# Patient Record
Sex: Male | Born: 1955 | Race: White | Hispanic: No | Marital: Single | Smoking: Former smoker
Health system: Southern US, Community
[De-identification: ages and names within clinical notes are randomized; demographics above are authoritative.]

## PROBLEM LIST (undated history)

## (undated) DIAGNOSIS — E119 Type 2 diabetes mellitus without complications: Secondary | ICD-10-CM

## (undated) DIAGNOSIS — I10 Essential (primary) hypertension: Secondary | ICD-10-CM

## (undated) DIAGNOSIS — E079 Disorder of thyroid, unspecified: Secondary | ICD-10-CM

## (undated) DIAGNOSIS — I6529 Occlusion and stenosis of unspecified carotid artery: Secondary | ICD-10-CM

## (undated) HISTORY — PX: EYE SURGERY: SHX253

## (undated) HISTORY — DX: Essential (primary) hypertension: I10

## (undated) HISTORY — DX: Type 2 diabetes mellitus without complications: E11.9

## (undated) HISTORY — PX: CATARACT EXTRACTION: SUR2

## (undated) HISTORY — DX: Occlusion and stenosis of unspecified carotid artery: I65.29

## (undated) HISTORY — DX: Disorder of thyroid, unspecified: E07.9

## (undated) HISTORY — PX: CHOLECYSTECTOMY: SHX55

## (undated) HISTORY — PX: THYROID SURGERY: SHX805

## (undated) HISTORY — PX: CAROTID ENDARTERECTOMY: SUR193

---

## 2004-01-29 ENCOUNTER — Ambulatory Visit: Payer: Self-pay | Admitting: Urgent Care

## 2004-02-07 ENCOUNTER — Ambulatory Visit (HOSPITAL_COMMUNITY): Admission: RE | Admit: 2004-02-07 | Discharge: 2004-02-07 | Payer: Self-pay | Admitting: Internal Medicine

## 2004-02-07 ENCOUNTER — Ambulatory Visit: Payer: Self-pay | Admitting: Internal Medicine

## 2004-02-26 ENCOUNTER — Ambulatory Visit: Payer: Self-pay | Admitting: Cardiology

## 2004-03-04 ENCOUNTER — Ambulatory Visit (HOSPITAL_COMMUNITY): Admission: RE | Admit: 2004-03-04 | Discharge: 2004-03-04 | Payer: Self-pay | Admitting: Cardiology

## 2004-03-04 ENCOUNTER — Ambulatory Visit: Payer: Self-pay | Admitting: Cardiology

## 2004-03-13 ENCOUNTER — Ambulatory Visit: Payer: Self-pay | Admitting: Cardiology

## 2005-02-04 ENCOUNTER — Ambulatory Visit: Payer: Self-pay | Admitting: Internal Medicine

## 2005-02-10 ENCOUNTER — Encounter (HOSPITAL_COMMUNITY): Admission: RE | Admit: 2005-02-10 | Discharge: 2005-02-10 | Payer: Self-pay | Admitting: Internal Medicine

## 2005-04-03 ENCOUNTER — Ambulatory Visit: Payer: Self-pay | Admitting: Internal Medicine

## 2005-04-07 ENCOUNTER — Ambulatory Visit (HOSPITAL_COMMUNITY): Admission: RE | Admit: 2005-04-07 | Discharge: 2005-04-07 | Payer: Self-pay | Admitting: Internal Medicine

## 2005-05-04 ENCOUNTER — Ambulatory Visit: Payer: Self-pay | Admitting: Internal Medicine

## 2005-06-01 ENCOUNTER — Ambulatory Visit: Payer: Self-pay | Admitting: Internal Medicine

## 2005-06-05 ENCOUNTER — Ambulatory Visit: Payer: Self-pay | Admitting: Cardiology

## 2005-06-22 ENCOUNTER — Ambulatory Visit: Payer: Self-pay | Admitting: Cardiology

## 2005-07-13 ENCOUNTER — Ambulatory Visit: Payer: Self-pay | Admitting: Cardiology

## 2005-07-14 ENCOUNTER — Ambulatory Visit: Payer: Self-pay | Admitting: Cardiology

## 2005-07-16 ENCOUNTER — Inpatient Hospital Stay (HOSPITAL_COMMUNITY): Admission: AD | Admit: 2005-07-16 | Discharge: 2005-07-24 | Payer: Self-pay | Admitting: *Deleted

## 2005-07-20 ENCOUNTER — Encounter (INDEPENDENT_AMBULATORY_CARE_PROVIDER_SITE_OTHER): Payer: Self-pay | Admitting: Specialist

## 2005-09-07 ENCOUNTER — Ambulatory Visit: Payer: Self-pay | Admitting: Cardiology

## 2008-07-19 ENCOUNTER — Inpatient Hospital Stay: Admission: RE | Admit: 2008-07-19 | Discharge: 2008-10-01 | Payer: Self-pay | Admitting: Internal Medicine

## 2008-07-31 ENCOUNTER — Ambulatory Visit (HOSPITAL_COMMUNITY): Admission: RE | Admit: 2008-07-31 | Discharge: 2008-07-31 | Payer: Self-pay | Admitting: Internal Medicine

## 2008-07-31 ENCOUNTER — Emergency Department (HOSPITAL_COMMUNITY): Admission: EM | Admit: 2008-07-31 | Discharge: 2008-08-01 | Payer: Self-pay | Admitting: Emergency Medicine

## 2010-03-31 ENCOUNTER — Encounter (HOSPITAL_BASED_OUTPATIENT_CLINIC_OR_DEPARTMENT_OTHER): Payer: Self-pay | Admitting: Internal Medicine

## 2010-06-15 LAB — GLUCOSE, CAPILLARY
Glucose-Capillary: 110 mg/dL — ABNORMAL HIGH (ref 70–99)
Glucose-Capillary: 126 mg/dL — ABNORMAL HIGH (ref 70–99)
Glucose-Capillary: 130 mg/dL — ABNORMAL HIGH (ref 70–99)
Glucose-Capillary: 144 mg/dL — ABNORMAL HIGH (ref 70–99)
Glucose-Capillary: 146 mg/dL — ABNORMAL HIGH (ref 70–99)
Glucose-Capillary: 149 mg/dL — ABNORMAL HIGH (ref 70–99)
Glucose-Capillary: 152 mg/dL — ABNORMAL HIGH (ref 70–99)
Glucose-Capillary: 152 mg/dL — ABNORMAL HIGH (ref 70–99)
Glucose-Capillary: 155 mg/dL — ABNORMAL HIGH (ref 70–99)
Glucose-Capillary: 156 mg/dL — ABNORMAL HIGH (ref 70–99)
Glucose-Capillary: 160 mg/dL — ABNORMAL HIGH (ref 70–99)
Glucose-Capillary: 184 mg/dL — ABNORMAL HIGH (ref 70–99)
Glucose-Capillary: 186 mg/dL — ABNORMAL HIGH (ref 70–99)
Glucose-Capillary: 193 mg/dL — ABNORMAL HIGH (ref 70–99)
Glucose-Capillary: 195 mg/dL — ABNORMAL HIGH (ref 70–99)
Glucose-Capillary: 195 mg/dL — ABNORMAL HIGH (ref 70–99)
Glucose-Capillary: 197 mg/dL — ABNORMAL HIGH (ref 70–99)
Glucose-Capillary: 227 mg/dL — ABNORMAL HIGH (ref 70–99)
Glucose-Capillary: 234 mg/dL — ABNORMAL HIGH (ref 70–99)
Glucose-Capillary: 262 mg/dL — ABNORMAL HIGH (ref 70–99)
Glucose-Capillary: 267 mg/dL — ABNORMAL HIGH (ref 70–99)
Glucose-Capillary: 282 mg/dL — ABNORMAL HIGH (ref 70–99)
Glucose-Capillary: 297 mg/dL — ABNORMAL HIGH (ref 70–99)
Glucose-Capillary: 309 mg/dL — ABNORMAL HIGH (ref 70–99)
Glucose-Capillary: 437 mg/dL — ABNORMAL HIGH (ref 70–99)
Glucose-Capillary: 451 mg/dL — ABNORMAL HIGH (ref 70–99)
Glucose-Capillary: 53 mg/dL — ABNORMAL LOW (ref 70–99)
Glucose-Capillary: 74 mg/dL (ref 70–99)
Glucose-Capillary: 96 mg/dL (ref 70–99)
Glucose-Capillary: 102 mg/dL — ABNORMAL HIGH (ref 70–99)
Glucose-Capillary: 131 mg/dL — ABNORMAL HIGH (ref 70–99)
Glucose-Capillary: 134 mg/dL — ABNORMAL HIGH (ref 70–99)
Glucose-Capillary: 143 mg/dL — ABNORMAL HIGH (ref 70–99)
Glucose-Capillary: 161 mg/dL — ABNORMAL HIGH (ref 70–99)
Glucose-Capillary: 165 mg/dL — ABNORMAL HIGH (ref 70–99)
Glucose-Capillary: 171 mg/dL — ABNORMAL HIGH (ref 70–99)
Glucose-Capillary: 176 mg/dL — ABNORMAL HIGH (ref 70–99)
Glucose-Capillary: 178 mg/dL — ABNORMAL HIGH (ref 70–99)
Glucose-Capillary: 178 mg/dL — ABNORMAL HIGH (ref 70–99)
Glucose-Capillary: 185 mg/dL — ABNORMAL HIGH (ref 70–99)
Glucose-Capillary: 189 mg/dL — ABNORMAL HIGH (ref 70–99)
Glucose-Capillary: 207 mg/dL — ABNORMAL HIGH (ref 70–99)
Glucose-Capillary: 212 mg/dL — ABNORMAL HIGH (ref 70–99)
Glucose-Capillary: 217 mg/dL — ABNORMAL HIGH (ref 70–99)
Glucose-Capillary: 222 mg/dL — ABNORMAL HIGH (ref 70–99)
Glucose-Capillary: 227 mg/dL — ABNORMAL HIGH (ref 70–99)
Glucose-Capillary: 238 mg/dL — ABNORMAL HIGH (ref 70–99)
Glucose-Capillary: 243 mg/dL — ABNORMAL HIGH (ref 70–99)
Glucose-Capillary: 257 mg/dL — ABNORMAL HIGH (ref 70–99)
Glucose-Capillary: 267 mg/dL — ABNORMAL HIGH (ref 70–99)
Glucose-Capillary: 297 mg/dL — ABNORMAL HIGH (ref 70–99)
Glucose-Capillary: 304 mg/dL — ABNORMAL HIGH (ref 70–99)
Glucose-Capillary: 319 mg/dL — ABNORMAL HIGH (ref 70–99)
Glucose-Capillary: 82 mg/dL (ref 70–99)
Glucose-Capillary: 89 mg/dL (ref 70–99)
Glucose-Capillary: 97 mg/dL (ref 70–99)

## 2010-06-16 LAB — GLUCOSE, CAPILLARY
Glucose-Capillary: 105 mg/dL — ABNORMAL HIGH (ref 70–99)
Glucose-Capillary: 105 mg/dL — ABNORMAL HIGH (ref 70–99)
Glucose-Capillary: 108 mg/dL — ABNORMAL HIGH (ref 70–99)
Glucose-Capillary: 111 mg/dL — ABNORMAL HIGH (ref 70–99)
Glucose-Capillary: 114 mg/dL — ABNORMAL HIGH (ref 70–99)
Glucose-Capillary: 117 mg/dL — ABNORMAL HIGH (ref 70–99)
Glucose-Capillary: 117 mg/dL — ABNORMAL HIGH (ref 70–99)
Glucose-Capillary: 122 mg/dL — ABNORMAL HIGH (ref 70–99)
Glucose-Capillary: 140 mg/dL — ABNORMAL HIGH (ref 70–99)
Glucose-Capillary: 143 mg/dL — ABNORMAL HIGH (ref 70–99)
Glucose-Capillary: 145 mg/dL — ABNORMAL HIGH (ref 70–99)
Glucose-Capillary: 156 mg/dL — ABNORMAL HIGH (ref 70–99)
Glucose-Capillary: 156 mg/dL — ABNORMAL HIGH (ref 70–99)
Glucose-Capillary: 166 mg/dL — ABNORMAL HIGH (ref 70–99)
Glucose-Capillary: 167 mg/dL — ABNORMAL HIGH (ref 70–99)
Glucose-Capillary: 168 mg/dL — ABNORMAL HIGH (ref 70–99)
Glucose-Capillary: 169 mg/dL — ABNORMAL HIGH (ref 70–99)
Glucose-Capillary: 172 mg/dL — ABNORMAL HIGH (ref 70–99)
Glucose-Capillary: 182 mg/dL — ABNORMAL HIGH (ref 70–99)
Glucose-Capillary: 194 mg/dL — ABNORMAL HIGH (ref 70–99)
Glucose-Capillary: 199 mg/dL — ABNORMAL HIGH (ref 70–99)
Glucose-Capillary: 206 mg/dL — ABNORMAL HIGH (ref 70–99)
Glucose-Capillary: 211 mg/dL — ABNORMAL HIGH (ref 70–99)
Glucose-Capillary: 212 mg/dL — ABNORMAL HIGH (ref 70–99)
Glucose-Capillary: 235 mg/dL — ABNORMAL HIGH (ref 70–99)
Glucose-Capillary: 242 mg/dL — ABNORMAL HIGH (ref 70–99)
Glucose-Capillary: 245 mg/dL — ABNORMAL HIGH (ref 70–99)
Glucose-Capillary: 255 mg/dL — ABNORMAL HIGH (ref 70–99)
Glucose-Capillary: 255 mg/dL — ABNORMAL HIGH (ref 70–99)
Glucose-Capillary: 255 mg/dL — ABNORMAL HIGH (ref 70–99)
Glucose-Capillary: 256 mg/dL — ABNORMAL HIGH (ref 70–99)
Glucose-Capillary: 257 mg/dL — ABNORMAL HIGH (ref 70–99)
Glucose-Capillary: 279 mg/dL — ABNORMAL HIGH (ref 70–99)
Glucose-Capillary: 285 mg/dL — ABNORMAL HIGH (ref 70–99)
Glucose-Capillary: 339 mg/dL — ABNORMAL HIGH (ref 70–99)
Glucose-Capillary: 356 mg/dL — ABNORMAL HIGH (ref 70–99)
Glucose-Capillary: 414 mg/dL — ABNORMAL HIGH (ref 70–99)
Glucose-Capillary: 60 mg/dL — ABNORMAL LOW (ref 70–99)
Glucose-Capillary: 62 mg/dL — ABNORMAL LOW (ref 70–99)
Glucose-Capillary: 87 mg/dL (ref 70–99)
Glucose-Capillary: 93 mg/dL (ref 70–99)
Glucose-Capillary: 97 mg/dL (ref 70–99)
Glucose-Capillary: 100 mg/dL — ABNORMAL HIGH (ref 70–99)
Glucose-Capillary: 128 mg/dL — ABNORMAL HIGH (ref 70–99)
Glucose-Capillary: 134 mg/dL — ABNORMAL HIGH (ref 70–99)
Glucose-Capillary: 139 mg/dL — ABNORMAL HIGH (ref 70–99)
Glucose-Capillary: 140 mg/dL — ABNORMAL HIGH (ref 70–99)
Glucose-Capillary: 159 mg/dL — ABNORMAL HIGH (ref 70–99)
Glucose-Capillary: 183 mg/dL — ABNORMAL HIGH (ref 70–99)
Glucose-Capillary: 195 mg/dL — ABNORMAL HIGH (ref 70–99)
Glucose-Capillary: 199 mg/dL — ABNORMAL HIGH (ref 70–99)
Glucose-Capillary: 210 mg/dL — ABNORMAL HIGH (ref 70–99)
Glucose-Capillary: 213 mg/dL — ABNORMAL HIGH (ref 70–99)
Glucose-Capillary: 215 mg/dL — ABNORMAL HIGH (ref 70–99)
Glucose-Capillary: 215 mg/dL — ABNORMAL HIGH (ref 70–99)
Glucose-Capillary: 215 mg/dL — ABNORMAL HIGH (ref 70–99)
Glucose-Capillary: 250 mg/dL — ABNORMAL HIGH (ref 70–99)
Glucose-Capillary: 271 mg/dL — ABNORMAL HIGH (ref 70–99)
Glucose-Capillary: 274 mg/dL — ABNORMAL HIGH (ref 70–99)
Glucose-Capillary: 277 mg/dL — ABNORMAL HIGH (ref 70–99)
Glucose-Capillary: 299 mg/dL — ABNORMAL HIGH (ref 70–99)
Glucose-Capillary: 303 mg/dL — ABNORMAL HIGH (ref 70–99)
Glucose-Capillary: 308 mg/dL — ABNORMAL HIGH (ref 70–99)
Glucose-Capillary: 318 mg/dL — ABNORMAL HIGH (ref 70–99)
Glucose-Capillary: 318 mg/dL — ABNORMAL HIGH (ref 70–99)
Glucose-Capillary: 320 mg/dL — ABNORMAL HIGH (ref 70–99)
Glucose-Capillary: 348 mg/dL — ABNORMAL HIGH (ref 70–99)
Glucose-Capillary: 372 mg/dL — ABNORMAL HIGH (ref 70–99)
Glucose-Capillary: 67 mg/dL — ABNORMAL LOW (ref 70–99)
Glucose-Capillary: 75 mg/dL (ref 70–99)
Glucose-Capillary: 97 mg/dL (ref 70–99)

## 2010-06-17 LAB — GLUCOSE, CAPILLARY
Glucose-Capillary: 101 mg/dL — ABNORMAL HIGH (ref 70–99)
Glucose-Capillary: 152 mg/dL — ABNORMAL HIGH (ref 70–99)
Glucose-Capillary: 169 mg/dL — ABNORMAL HIGH (ref 70–99)
Glucose-Capillary: 176 mg/dL — ABNORMAL HIGH (ref 70–99)
Glucose-Capillary: 214 mg/dL — ABNORMAL HIGH (ref 70–99)
Glucose-Capillary: 226 mg/dL — ABNORMAL HIGH (ref 70–99)
Glucose-Capillary: 228 mg/dL — ABNORMAL HIGH (ref 70–99)
Glucose-Capillary: 231 mg/dL — ABNORMAL HIGH (ref 70–99)
Glucose-Capillary: 246 mg/dL — ABNORMAL HIGH (ref 70–99)
Glucose-Capillary: 262 mg/dL — ABNORMAL HIGH (ref 70–99)
Glucose-Capillary: 262 mg/dL — ABNORMAL HIGH (ref 70–99)
Glucose-Capillary: 276 mg/dL — ABNORMAL HIGH (ref 70–99)
Glucose-Capillary: 292 mg/dL — ABNORMAL HIGH (ref 70–99)
Glucose-Capillary: 310 mg/dL — ABNORMAL HIGH (ref 70–99)
Glucose-Capillary: 310 mg/dL — ABNORMAL HIGH (ref 70–99)
Glucose-Capillary: 319 mg/dL — ABNORMAL HIGH (ref 70–99)
Glucose-Capillary: 329 mg/dL — ABNORMAL HIGH (ref 70–99)
Glucose-Capillary: 332 mg/dL — ABNORMAL HIGH (ref 70–99)
Glucose-Capillary: 355 mg/dL — ABNORMAL HIGH (ref 70–99)
Glucose-Capillary: 370 mg/dL — ABNORMAL HIGH (ref 70–99)
Glucose-Capillary: 374 mg/dL — ABNORMAL HIGH (ref 70–99)
Glucose-Capillary: 386 mg/dL — ABNORMAL HIGH (ref 70–99)
Glucose-Capillary: 393 mg/dL — ABNORMAL HIGH (ref 70–99)
Glucose-Capillary: 401 mg/dL — ABNORMAL HIGH (ref 70–99)
Glucose-Capillary: 410 mg/dL — ABNORMAL HIGH (ref 70–99)
Glucose-Capillary: 415 mg/dL — ABNORMAL HIGH (ref 70–99)
Glucose-Capillary: 444 mg/dL — ABNORMAL HIGH (ref 70–99)
Glucose-Capillary: 454 mg/dL — ABNORMAL HIGH (ref 70–99)
Glucose-Capillary: 277 mg/dL — ABNORMAL HIGH (ref 70–99)
Glucose-Capillary: 460 mg/dL — ABNORMAL HIGH (ref 70–99)

## 2010-06-17 LAB — BASIC METABOLIC PANEL
CO2: 29 mEq/L (ref 19–32)
Chloride: 96 mEq/L (ref 96–112)
GFR calc non Af Amer: >60 mL/min (ref >60)
Glucose, Bld: 271 mg/dL — ABNORMAL HIGH (ref 70–99)
Potassium: 4.6 mEq/L (ref 3.5–5.1)
Sodium: 132 mEq/L — ABNORMAL LOW (ref 135–145)

## 2010-06-17 LAB — CBC
HCT: 27.8 % — ABNORMAL LOW (ref 39.0–52.0)
Hemoglobin: 9.7 g/dL — ABNORMAL LOW (ref 13.0–17.0)
RDW: 13.4 % (ref 11.5–15.5)

## 2010-06-17 LAB — CULTURE, BLOOD (ROUTINE X 2)
Culture: NO GROWTH
Report Status: 5312010
Report Status: 5312010

## 2010-06-17 LAB — DIFFERENTIAL
Basophils Absolute: 0 10*3/uL (ref 0.0–0.1)
Eosinophils Relative: 1 % (ref 0–5)
Lymphocytes Relative: 6 % — ABNORMAL LOW (ref 12–46)
Monocytes Absolute: 0.9 10*3/uL (ref 0.1–1.0)

## 2010-07-25 NOTE — Op Note (Signed)
NAMETERREN, JANDREAU                 ACCOUNT NO.:  [id number]   MEDICAL RECORD NO.:  [id number]          PATIENT TYPE:  INP   LOCATION:  3316                         FACILITY:  [facility]   PHYSICIAN:  Balinda Quails, M.D.    DATE OF BIRTH:  May 25, 1955   DATE OF PROCEDURE:  07/20/2005  DATE OF DISCHARGE:                                 OPERATIVE REPORT   SURGEON:  Balinda Quails, MD   ASSISTANT:  Pecola Leisure, PA.   ANESTHETIC:  General endotracheal.   ANESTHESIOLOGIST:  Dr. Krista Blue.   PREOPERATIVE DIAGNOSIS:  Severe right internal carotid artery stenosis.   POSTOP:  Severe right internal carotid artery stenosis.   PROCEDURE:  Right carotid endarterectomy Dacron patch angioplasty.   CLINICAL NOTE:  Charles Castaneda is a 55 year old type 1 diabetic who was  recently seen in the office with a severe right internal carotid artery  stenosis.  At that time some subtle mental status changes had been  identified and he was admitted to hospital.  Workup for this was essentially  unremarkable.  He is brought to the operating room at this time for right  carotid endarterectomy for reduction of stroke risk.   OPERATIVE PROCEDURE:  The patient left the operating room stable condition.  Placed in supine position.  General endotracheal anesthesia induced.  Foley  catheter and arterial line in place.  Right neck prepped and draped in  sterile fashion.   Curvilinear skin incision made on the anterior border of the right  sternomastoid muscle.  Dissection carried through subcutaneous tissue with  electrocautery.  Deep dissection carried down through the platysma.  The  dissection carried along the upper border sternomastoid to expose the  carotid bifurcation.  Facial vein ligated with 2-0 silk and divided.  Carotid bifurcation exposed.  Common carotid artery mobilized down the  omohyoid muscle encircled with vessel loop.  The vagus nerve reflected  posteriorly and preserved.  The origin of superior  thyroid external carotid  were freed and encircled vessels.  The internal carotid artery followed  distally up to the posterior belly of the digastric muscle and encircled  with vessel loop.  The hypoglossal nerve was identified superiorly and  preserved.   Carotid vessels were noted to be small in caliber with plaque extending from  the bulb up into the internal carotid artery origin.  The patient  administered 7000 units heparin intravenously.  Adequate circulation time  permitted.  The carotid vessels controlled with clamps.  Longitudinal  arteriotomy made in the distal common carotid artery.  The arteriotomy  extended across carotid bulb up into the internal carotid artery.  There was  a high-grade stenosis in the right internal carotid artery present.  Shunt  was inserted.   Plaque removed with an endarterectomy elevator.  The plaque raised up into  the internal carotid artery where it feathered out distally.  The external  carotid was endarterectomized using an eversion technique.  Plaque raised  down into the common carotid artery where it was divided transversely with  Potts scissors.  Fragments of plaque were removed  with fine forceps.  This  area with heparin saline solution and dextran solution.  A patch angioplasty  of the endarterectomy site carried out with a running 6-0 Prolene suture  using a Finesse Dacron patch.  The completion of the patch angioplasty,  shunt was removed.  All vessels well flushed.  Clamps removed directing  initial antegrade flow up the external carotid artery.  Following this, the  internal carotid was released.   There is an excellent pulse and Doppler signal in the distal internal  carotid artery.   Adequate hemostasis obtained.  Sponge and instrument counts correct.  This  patient administered 50 mg protamine intravenously.   Sternomastoid fascia closed running 2-0 Vicryl suture.  Platysma closed with  running 3-0 Vicryl suture.  Skin closed  with 4-0 Monocryl.  Steri-Strips  applied.   The patient tolerated procedure well.  Transferred to recovery in stable  condition.      Balinda Quails, M.D.  Electronically Signed     PGH/MEDQ  D:  07/20/2005  T:  07/21/2005  Job:  [id number]   cc:   Dr. Weyman Pedro, [place]

## 2010-07-25 NOTE — Discharge Summary (Signed)
Charles Castaneda, Charles Castaneda                 ACCOUNT NO.:  [id number]   MEDICAL RECORD NO.:  [id number]          PATIENT TYPE:  INP   LOCATION:  2006                         FACILITY:  [facility]   PHYSICIAN:  Balinda Quails, M.D.    DATE OF BIRTH:  04/07/1955   DATE OF ADMISSION:  07/16/2005  DATE OF DISCHARGE:  07/24/2005                                 DISCHARGE SUMMARY   ADMISSION DIAGNOSIS:  Increased confusion and dizziness.   DISCHARGE DIAGNOSES:  1.  Right carotid stenosis.  2.  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  3.  Hypertension.  4.  Hyperlipidemia.  5.  Hypothyroidism.  6.  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   CONSULTING PHYSICIAN:  On Jul 16, 2005, Dr. Liliane Bade consulted for  vascular surgery.   PROCEDURE:  On Jul 20, 2005, the patient underwent right carotid  endarterectomy with Dacron patch angioplasty by Dr. Liliane Bade.   HISTORY AND PHYSICAL:  This patient is a 55 year old Caucasian male who was  recently admitted to [facility] on Jul 12, 2005 with complaints of  increased blood pressure, nausea and palpitations.  The patient was admitted  under Dr. Eliberto Ivory at this time.  During his admission, he had a cardiology  consult to evaluate him.  The patient was placed on telemetry monitor for  evaluation.  Cardiac workup was also done, which is not currently available.  The patient also underwent a CT scan.  This showed it to be negative for any  intracranial hemorrhage or mass.  Carotid duplex ultrasound done on Jul 13, 2005 showed the right internal carotid artery to have an 89 percent  stenosis, the left had 40 to 59 percent.  The patient was discharged from  [facility] several days later.  He had an elective appointment  scheduled with Dr. Madilyn Fireman on Jul 16, 2005.  Dr. Madilyn Fireman was contacted by Dr.  Eliberto Ivory to discuss the patient possibly undergoing TIA or CVA.  The patient  was seen  and evaluated in Dr. Madilyn Fireman' office.  The patient now complained of  increasing confusion, dizziness, numbness and unable to manage insulin.  He  further stated he noted slight slurred speech in the patient.  The patient  states he has been speaking slower.  He is unsteady on his feet.  The  patient states that this has been going on for a week now.  He states this  is pretty persistent.  The patient has experienced __________ to place him  in a nursing home but __________.  He denies any muscle weakness.  The  patient denies any chest pain or shortness of breath.  He denies any  syncope, pre-syncope, numbness or tingling.  The patient is being admitted  to [facility] under Dr. Liliane Bade to rule out TIA and CVA.  The  patient has known internal carotid artery stenosis.   HOSPITAL COURSE:  On Jul 17, 2005, the patient did experience some  hyponatremia.  He was then started on  IV fluids.  Neurology was following  the patient.  The patient was maintained on a cholesterol-modified diet.  The patient's IV fluids were discontinued on Jul 18, 2005.  The patient did  experience some urinary retention on Jul 18, 2005 and Foley was reinserted.  The patient was scheduled for surgery Jul 20, 2005.  Prior to this, he was  maintained on his home medications.   On postoperative day #1, the patient was afebrile and his vital signs  remained stable.  He was hemodynamically stable.  He was continued on a  sliding scale insulin for diabetes mellitus.  On postoperative day #2, the  patient is alert and oriented times three with a flat affect.  Labs again  were within normal limits.  Physical therapy was consulted to assess his  gait.  The patient did have urinary retention postoperatively.  His Foley  was reinserted.  The patient was started on Flomax and a urology consult was  obtained.  Urology stated the patient would have to go home with his  catheter with a two-week voiding trial.  The patient  will remain on Flomax  and Proscar.  The patient was able to ambulate on his own with a steady gait  by postoperative day #4.  The patient is going to be discharged home with  home health to help him with his daily needs.  The patient did have an  episode of hypoglycemia postoperatively.  His Lantus at noon was decreased  and this improved appropriately.   PHYSICAL EXAMINATION:  Physical exam on discharge:  VITAL SIGNS:  Blood pressure 141/80.  Heart rate 72.  Respiratory rate 20.  Temperature 96.3.  Oxygen saturation is 97 percent on room air.  CBG 140,  101.  CARDIAC:  Regular rate and rhythm.  RESPIRATORY:  Clear to auscultation bilaterally.  ABDOMEN:  Benign.  EXTREMITIES:  No edema.   LABORATORY DATA:  BMP showed a sodium of 142, potassium 4.7, chloride 101,  CO2 of 32, BUN 17 and creatinine 1.1, and a glucose of 79.   CONDITION ON DISCHARGE:  Stable.   DISPOSITION:  The patient will be discharged to home with home health care.   DISCHARGE MEDICATIONS:  Medications include:  1.  Flomax 0.4 mg p.o. daily.  2.  Proscar 5 mg p.o. daily.  3.  Tylox one to two tablets every four hours p.r.n.  4.  Benicar 40 mg p.o. daily.  5.  Metoprolol 75 mg p.o. b.i.d.  6.  Felodipine 2.5 mg p.o. daily.  7.  Levoxyl 150 mcg p.o. daily.  8.  AcipHex 20 mg p.o. daily.  9.  Metoclopramide 10 mg p.o. before meals.  10. Promethazine 25 mg p.o. q.i.d. p.r.n.  11. Xanax one-half tablet 0.5 mg p.o. b.i.d. p.r.n.  12. Lipitor 20 mg p.o. daily.  13. Multivitamin p.o. daily.  14. Aspirin 81 mg p.o. daily.  15. Lantus insulin 58 units subcutaneously daily.  16. Humalog sliding scale insulin.   DISCHARGE INSTRUCTIONS:  The patient is instructed to follow a low-fat, low-  salt, diabetic diet.  No driving or heavy lifting of greater than 10 pounds  fo5 three weeks.  The patient may shower and clean his wounds with mild soap and water.  He is to ambulate two to four times a day and increase activity   as tolerated.  The patient is to continue his breathing exercises.   The patient has a follow-up appointment with Dr. Madilyn Fireman on August 13, 2005 at  12:30 PM.  He will seen Dr. Aldean Ast for an appointment in two weeks for  his voiding trial with his Foley.  The patient can call the office with any  problems at [phone].  The patient will have a home health nurse to assist  with his daily needs.      Constance Holster, PA      P. Liliane Bade, M.D.  Electronically Signed    JMW/MEDQ  D:  08/20/2005  T:  08/20/2005  Job:  [id number]   cc:   Courtney Paris, M.D.  Fax: [phone]

## 2010-07-25 NOTE — Consult Note (Signed)
NAMENYZIER, BOIVIN                 ACCOUNT NO.:  [id number]   MEDICAL RECORD NO.:  [id number]           PATIENT TYPE:   LOCATION:                                 FACILITY:   PHYSICIAN:  R. Roetta Sessions, M.D. DATE OF BIRTH:  [birth year]   DATE OF CONSULTATION:  01/29/2004  DATE OF DISCHARGE:                                   CONSULTATION   REASON FOR CONSULTATION:  Abdominal pain, abnormal HIDA ultrasound.   Mr. Charles Castaneda is a 55 year old gentleman with at least a 30-year history  of insulin-dependent diabetes mellitus, referred out of the courtesy of Dr.  Weyman Pedro with [facility] in [place], [place], for  further evaluation of a two-month history of abdominal pain.  He describes  epigastric, right upper quadrant abdominal pain off and on throughout the  day, often relieved by eating.  He denies any precipitation of pain after a  meal.  It comes and goes and may last several minutes to a couple of hours.  Recent ultrasound at [facility] demonstrated cholelithiasis, no  gallbladder wall thickening, slight enlargement of the liver, but no biliary  dilation.  Subsequently a HIDA scan demonstrated a gallbladder EF of 2% even  after two doses of CCK.  The gallbladder rapidly imaged.  He did not,  however, recall having any abdominal pain consistent with his presenting  complaint after the study.  He has not had fever or chills, no yellow  jaundice, no clay-colored stools, dark-colored urine.   Mr. Teall on his own took some Prilosec OTC intermittently for a short time  one month ago and it made no difference with the above-mentioned symptoms.   Laboratory evaluation from January 09, 2004, demonstrated a white count of  5.8, H&H of 14.5 and 43.2.  SGPT of 44, GOT of 29, total bilirubin 0.3.  Amylase is 51.  He has had some intermittent nausea and vomiting with the  above-mentioned symptoms, but this has not been a major component of his  concern.  Has not had  any melena or rectal bleeding, no constipation or  diarrhea.  No family history of colorectal neoplasia.  His mother is dying  with pancreatic cancer.   PAST MEDICAL HISTORY:  1.  Longstanding history of insulin-dependent diabetes mellitus, relatively      poor control.  The patient reports last hemoglobin A1C at 8.  2.  History of hypothyroidism on supplementation.  3.  He has hypertension.  4.  History of thyroidectomy several years ago.  5.  History of left knee arthroscopy.  6.  History of eye surgery, bilateral vitrectomies, repair of left retinal      detachment; however, he is blind in the left eye.   MEDICATIONS:  1.  Levoxyl 0.15 mg daily.  2.  HCTZ 18 mg daily.  3.  Benicar 10 mg daily.  4.  Metoprolol 50 mg daily.  5.  Multivitamin daily.  6.  Lantus 56 units daily.  7.  Humalog p.r.n.  8.  Lipitor was discontinued recently.   ALLERGIES:  PENICILLIN.   FAMILY  HISTORY:  Mother has pancreatic cancer.  Father died with an enlarged  heart, stroke, COPD.  One brother has throat cancer.  Otherwise no history  of chronic GI or liver illness.   SOCIAL HISTORY:  The patient is single.  He is disabled.  He does not use  tobacco or alcohol.   REVIEW OF SYSTEMS:  As in history of present illness.   PHYSICAL EXAMINATION:  GENERAL:  A pleasant 55 year old gentleman resting  comfortably.  VITAL SIGNS:  Weight 212.  Temperature 98.1, BP 172/118, pulse 78.  SKIN:  Warm and dry.  No jaundice.  No cutaneous stigmata of chronic liver  disease.  HEENT:  No scleral icterus.  JVD is not prominent.  CHEST:  Lungs are clear to auscultation.  CARDIAC:  Regular rate and rhythm without murmur, gallop, or rub.  ABDOMEN:  Nondistended, positive bowel sounds, soft, with no elicited  tenderness to palpation.  The liver edge is percussed just to the right  costal margin.  I do not appreciate a spleen.  EXTREMITIES:  No edema.   Ultrasound findings as outlined above.  Common bile duct  measured 3 mm.  Ultrasound from January 03, 2004, demonstrated suggestion of acoustic  shadowing in the neck of the gallbladder, suggesting a single calculus, and  again the gallbladder wall was not thickened.   IMPRESSION:  Mr. Dairon Procter is a 55 year old gentleman with a two-month  history of epigastric right upper quadrant abdominal pain that is sometimes  relieved by eating.  There is a questionable single calculus in the neck of  the gallbladder without other significant findings.  HIDA scan markedly  abnormal with a near 0 gallbladder ejection fraction after two doses of CCK  but without reproduction in symptoms.   While his symptoms are atypical for gallbladder disease, I am suspicious  that gallbladder may be the cause of his symptoms.  Gallbladder disease,  clinically, may be somewhat elusive in a setting of longstanding insulin-  dependent diabetes mellitus.   Mild elevation of transferase is nonspecific, could be secondary to fatty  liver or other causes.   I suspect he will end up seeing a surgeon for cholecystectomy; however, I  feel that we need to rule out peptic ulcer disease and other potential  pathologies of upper gastrointestinal tract contributing to his symptoms.   RECOMMENDATIONS:  To this end, I have offered the patient an EGD largely to  rule out peptic ulcer disease.  If this study is negative, then I would  proceed with surgical consultation for elective cholecystectomy and liver  biopsy  at the time of surgery.  I have discussed this approach at length with Mr.  Ewing, his questions were answered, he is agreeable.  Will make further  recommendations in the very near future.   I would like to thank Dr. Weyman Pedro for his kind referral.     R. M   RMR/MEDQ  D:  01/29/2004  T:  01/29/2004  Job:  [id number]   cc:   Weyman Pedro, M.D.  [facility]

## 2010-07-25 NOTE — H&P (Signed)
NAMELABRADFORD, Charles Castaneda                 ACCOUNT NO.:  [id number]   MEDICAL RECORD NO.:  [id number]          PATIENT TYPE:  INP   LOCATION:  2007                         FACILITY:  [facility]   PHYSICIAN:  Balinda Quails, M.D.    DATE OF BIRTH:  07-20-1955   DATE OF ADMISSION:  07/16/2005  DATE OF DISCHARGE:                                HISTORY & PHYSICAL   CHIEF COMPLAINT:  Increased confusion and dizziness.   HISTORY OF PRESENT ILLNESS:  The patient is a 56 year old Caucasian male who  was recently admitted to [facility] on the Jul 12, 2005 with  complaints of increased blood pressure, nausea and palpitations.  The  patient was admitted under Dr. Eliberto Ivory at that time.  During his admission,  cardiology was consulted to evaluate.  The placed the patient on telemetry  monitor for evaluation.  Cardiac workup was also done which is not currently  available.  The patient also underwent a CT scan that showed it to be  negative for any intracranial hemorrhage or mass.  Carotid duplex ultrasound  was done on Jul 13, 2005 which showed the right ICA to have 80-99% stenosis,  the  left to have 40-59%.  The patient was discharged from [facility]  several days later.  He had an elective appointment scheduled with Dr. Madilyn Fireman  today, Jul 16, 2005.  Dr. Madilyn Fireman was contacted by Dr. Eliberto Ivory to discuss  patient possibly undergoing TIA or CVA.  The patient was seen and evaluated  by Dr. Madilyn Fireman in the office.  The patient now complains of increased  confusion, dizziness, unable to manage insulin.  Further states has noticed  slight slurred speech in the patient.  The patient states has been speaking  slower.  States he is unsteady on his feet.  The patient says that this has  been going on for over a week now.  States that this is pretty persistent.  The patient is experiencing a lot of stress due to placing mother in nursing  home and one brother having a terminal illness.  He denies any muscle  weakness.  He denies any chest pain or shortness of breath.  Denies any  syncope, presyncope, numbness or tingling.   PAST MEDICAL HISTORY:  1.  Diabetes mellitus on insulin.  2.  Hypertension.  3.  Hyperlipidemia.  4.  Hypothyroidism.  5.  Neuropathy.  6.  Diabetic gastroparesis.  7.  History of retinal detachment, blind in left side.   PAST SURGICAL HISTORY:  1.  Status post cholecystectomy.  2.  Status post partial thyroidectomy.  3.  Status post vitrectomy bilateral eyes.   ALLERGIES:  Allergic to PENICILLIN.   MEDICATIONS:  1.  Benicar 40 mg daily.  2.  Metoprolol 75 mg b.i.d.  3.  HCTZ 12.5 mg daily.  4.  Felodipine 2.5 mg daily.  5.  Levoxyl 150 mcg daily.  6.  AcipHex 20 mg daily.  7.  __________ 10 mg t.i.d.  8.  Promethazine 25 mg q.6h. p.r.n.  9.  Xanax 0.5 mg b.i.d. p.r.n.  10. Lipitor 20 mg  every other day.  11. Multivitamin daily.  12. Aspirin 81 mg daily.  13. Lantus insulin 8 units daily at lunch.  14. Humalog insulin sliding scale.  15. Aspirin 325 mg b.i.d.  16. Stool softener p.r.n.   SOCIAL HISTORY:  The patient is currently single, lives at home alone.  Has  a history of occasional cigar and pipe use.  States last time was three  months ago.  Denies any alcohol use.  The patient is currently  retired/disabled.  Lives in [place].   FAMILY HISTORY:  Mother positive for pancreatic cancer, history of  myocardial infarction, still living at 33.  Father history of coronary  artery disease and cerebrovascular accident, deceased at 55.   REVIEW OF SYSTEMS:  See HPI for pertinent positives and negatives.  The  patient complains loss of appetite.  States weight loss of 16 pounds over  the last year.  Also complains of recent palpitations noted.  Complains of  reflex as well as increased urination and constipation.  He denies any chest  pains, orthopnea, proximal nocturnal dyspnea, shortness of breath, cough,  hemoptysis, wheezing.  Denies any nausea,  vomiting, melena, hematemesis,  hematochezia.  The patient denies any dysuria.   PHYSICAL EXAM:  GENERAL:  Well-developed, well-nourished white male in no  acute distress.  VITAL SIGNS:  Blood pressure 154/80, pulse of 80.  HEENT:  Normocephalic, atraumatic.  Pupils equal, round, react to light and  accommodation.  Oral mucosa is pink and moist.  NECK:  Supple.  No carotid bruits noted.  RESPIRATORY:  Clear to auscultation bilaterally.  CARDIAC:  Regular rate and rhythm.  S1 and S2 noted.  No murmurs, gallops,  rubs noted.  ABDOMEN:  Bowel sounds x4.  Soft, nontender on palpation.  GENITOURINARY:  Deferred.  RECTAL:  Deferred.  EXTREMITIES:  No edema, cyanosis or clubbing noted.  The patient's upper and  lower extremities warm to touch.  He has 2+ bilateral radial, femoral, DP  and PT pulses noted.  NEUROLOGIC:  Cranial nerves II-XII intact.  Patient is alert and oriented  x4.  Muscle strength 5/5 noted upper and lower extremities bilaterally.   IMPRESSION/PLAN:  The patient is to be admitted to [facility] under  Dr. Madilyn Fireman.  Rule out transient ischemic attack/cerebrovascular accident.  Patient with known right internal carotid artery stenosis.  Plan will be to  obtain an MRI of the brain as well as an MRA of the head and neck.  The  patient will be placed on home medications.  He will be admitted to 2000  telemetry floor heart rate monitor.  Plan will be based upon the reports of  exams.  Stephanie Acre Dominick, PA      P. Liliane Bade, M.D.  Electronically Signed    KMD/MEDQ  D:  07/16/2005  T:  07/16/2005  Job:  [id number]   cc:   Everardo All. Madilyn Fireman, M.D.  Fax: [phone]

## 2010-07-25 NOTE — Consult Note (Signed)
Charles Castaneda, Charles Castaneda                 ACCOUNT NO.:  [id number]   MEDICAL RECORD NO.:  [id number]          PATIENT TYPE:  INP   LOCATION:  2006                         FACILITY:  [facility]   PHYSICIAN:  Courtney Paris, M.D.DATE OF BIRTH:  01/25/1956   DATE OF CONSULTATION:  07/23/2005  DATE OF DISCHARGE:                                   CONSULTATION   Requested by Balinda Quails, M.D.   REASON FOR CONSULTATION:  Urinary retention.   HISTORY:  This 55 year old white male was admitted nearly a week ago with  some confusion and dizziness and some gait difficulties for several days.  He was found to have a right carotid artery stenosis and underwent surgery  several days ago.  He has been disabled from diabetes since 1991.  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  He is single, lives alone now that his mother  has been placed in a nursing home with cancer of the pancreas.  He denies  previous history of voiding problems.  He does sit to void at night, gets up  usually one time.  He has never had a UTI or prostatitis, never had  hematuria or kidney stones.  He has had other operations and did not have  voiding difficulties with that.   Previous operations include a cholecystectomy in January 2006, a partial  thyroidectomy in 1974, vitrectomy of his both eyes 1992 and 1996, and a  right cataract in 2006, plus the right carotid endarterectomy done last  week.  Prior to coming to the hospital he denied any voiding problems but  since he was here was given fluids.  He was in-and-out catheterized for 800  mL last week and then 5 days ago was catheterized for 1800 mL of urine.  He  failed another voiding trial 2 days ago with over 800 mL in his bladder upon  catheterization after 10 hours was just started on Flomax yesterday.   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   He has allergies to PENICILLIN.   HOME MEDICINES:  Bisoprolol, hydrochlorothiazide, felodipine, Levoxyl,  metoclopramide, Xanax, aspirin, Benicar, Lipitor, Lantus and Humalog  insulin, stool softeners.   A 14-system review was negative except as above.   He does not smoke or drink alcohol.  Does not use drugs.  Drug screen  negative when he came in with confusion.   FAMILY HISTORY:  His father died of a stroke.  His mother has cancer of the  pancreas, and his brother also recently died of cancer of the pancreas as  well.  He has no other family history of prostate cancer, kidney stones or  other heart disease.   PHYSICAL EXAMINATION:  GENERAL:  He is a pleasant young white male with a  gray beard in no acute distress.  He is a somewhat sad-appearing white male  in no acute distress.  VITAL SIGNS:  Temperature  is 98.6, blood pressure 171/81, pulse 85,  respirations 20.  HEENT:  Clear.  He Does have no apparent vision from his left eye.  His  oropharynx is clear.  NECK:  Supple.  LUNGS:  Clear.  ABDOMEN:  Soft.  He has a recent scar on his right neck.  CHEST:  Otherwise clear.  ABDOMEN:  Soft, benign, without masses or tenderness.  GENITOURINARY:  Circumcised penis with Foley catheter in place.  Bilaterally  descended, normal-sized testes without tenderness.  Prostate not very much  enlarged, probably 25-30 g in size, not fixed or indurated.  SVs not  palpable.  EXTREMITIES:  Negative.  No edema.  Good distal pulses.  Does have decreased  sensation to light touch bilaterally.  Faint distal pulses noted.   IMPRESSION:  1.  Urinary retention.  2.  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  3.  Recent right carotid endarterectomy.   RECOMMENDATIONS:  Leave Foley catheter for least 2 weeks.  He will come to  my office at that time.  We will instruct him how to remove the catheter 8-  10 hours before he  comes.  The nurses will show him how to care for and  manage both the leg bag and a large drainage bag for night time use.  Would  add finasteride to his regimen of Flomax to take until I see him to help  improve odds for a voiding trial, but it is going to take time for the  bladder tone to be regained after it has been stretched so much.  He will  not need antibiotics until the catheter is out.   I appreciate seeing the patient.      Courtney Paris, M.D.  Electronically Signed     HMK/MEDQ  D:  07/23/2005  T:  07/23/2005  Job:  [id number]

## 2010-07-25 NOTE — Op Note (Signed)
NAME:  Charles Castaneda, Charles Castaneda                 ACCOUNT NO.:  [id number]   MEDICAL RECORD NO.:  [id number]          PATIENT TYPE:  AMB   LOCATION:  DAY                           FACILITY:  [facility]   PHYSICIAN:  R. Roetta Sessions, M.D. DATE OF BIRTH:  January 19, 1956   DATE OF PROCEDURE:  02/07/2004  DATE OF DISCHARGE:                                 OPERATIVE REPORT   INDICATIONS FOR PROCEDURE:  The patient is a 55 year old gentleman with  epigastric right upper quadrant abdominal pain, question of single calculus  in the neck of the gallbladder on ultrasound. HIDA markedly abnormal with a  gallbladder of nearly zero with two doses of CCK but no reproduction in  symptoms. EGD is now being done to further evaluate his pain. This approach  has been discussed with the patient at length. Potential risks, benefits,  and alternatives have been reviewed. Questions answered. Please see  documentation in medical record for more information.   PROCEDURE:  O2 saturation, blood pressure, pulse, and respirations were  monitored throughout the entire procedure. Conscious sedation with Versed 3  mg IV and Demerol 75 mg IV in divided doses.   INSTRUMENT:  Olympus video chip system.   FINDINGS:  Examination of the tubular esophagus revealed no mucosal  abnormalities.  EG junction easily transversed.   Stomach:  The gastric cavity had some retained food. It insufflated well  with air. I was able to wash out the food and get adequate visualization of  the gastric mucosa. The gastric mucosa appeared normal.   Retroflection to view the proximal stomach and esophagogastric junction was  also undertaken. Pylorus patent and easily transversed. Examination of bulb  and second portion revealed no abnormalities.   THERAPY/DIAGNOSTIC MANEUVERS:  None. The patient tolerated the procedure  well and was reactive to endoscopy.   IMPRESSION:  Normal esophagus. Some retained food in the stomach. Otherwise  normal gastric mucosa,  normal D1 and D2.   RECOMMENDATIONS:  I feel that his symptoms are most likely emanating from  his gallbladder at this time. I have recommended he be referred to a surgeon  for consideration of cholecystectomy. He had a history of mildly elevated  aminotransferases. I have also recommended a liver biopsy at the time of  cholecystectomy.     Otelia Sergeant  RMR/MEDQ  D:  02/27/2004  T:  02/27/2004  Job:  [id number]

## 2010-07-25 NOTE — Cardiovascular Report (Signed)
NAMETELLY, JAWAD                 ACCOUNT NO.:  [id number]   MEDICAL RECORD NO.:  [id number]          PATIENT TYPE:  OIB   LOCATION:  2899                         FACILITY:  [facility]   PHYSICIAN:  Charlies Constable, M.D. [facility] DATE OF BIRTH:  Nov 24, 1955   DATE OF PROCEDURE:  03/04/2004  DATE OF DISCHARGE:                              CARDIAC CATHETERIZATION   Mr. Yom is 55 years old and has insulin-dependent diabetes, hypertension  and hyperlipidemia.  He recently had a Cardiolite scan performed for  preoperative assessment prior to possible gallbladder surgery.  This was  read by Dr. Andee Lineman as showing anterior ischemia.  Dr. Raul Del called me and  we arranged for him to come for evaluation with angiography.   DESCRIPTION OF PROCEDURE:  The procedure was performed with the right  femoral artery using arterial sheath and 6 French freeform coronary  catheters.  A __________ was performed and nonopaque contrast was used.  A  distal aortogram was performed to rule out renovascular causes for  hypertension.  The right femoral artery was closed with Angioseal at the end  of the procedure.  The patient tolerated the procedure well and left the  laboratory in satisfactory condition.   RESULTS:  The left ventricular pressure was 159/15.  The aortic pressure was  159/85.   The left main coronary artery was free of significant disease.   The left anterior descending artery gave rise to three diagonal branches and  four septal perforators.  The LAD was irregular and there was 30% narrowing  its proximal portion with more irregularities in its distal portion.   The circumflex artery was a moderate sized vessel and gave rise to an atrial  branch, a small marginal branch, two larger marginal branches and a  posterolateral branch.  There were irregularities in this vessel, but no  significant obstruction.   The right coronary artery was a moderate sized vessel and gave rise to a  right ventricular  branch, a posterior descending branch and a very small  posterolateral branch.  This vessel had irregularities, but no significant  obstruction.   The left ventriculogram was performed in the RAO projection and showed good  wall motion with no areas of hypokinesis.  The estimated ejection fraction  was 60%.   A distal aortogram was performed and showed patent renal arteries and no  significant iliac obstruction.   CONCLUSION:  Mild nonobstructive coronary artery disease with 30% narrowing  in the proximal left anterior descending artery, luminal irregularities in  the circumflex and right coronary artery and normal LV function.   RECOMMENDATIONS:  Reassurance and continued secondary risk factor  modification.  The patient should be at low cardiac risk for gallbladder  surgery.       BB/MEDQ  D:  03/04/2004  T:  03/04/2004  Job:  [id number]   cc:   Jackson Latino, M.D. [facility]

## 2010-07-25 NOTE — Consult Note (Signed)
NAMEQUANDRE, Charles Castaneda                 ACCOUNT NO.:  [id number]   MEDICAL RECORD NO.:  [id number]          PATIENT TYPE:  INP   LOCATION:  2007                         FACILITY:  [facility]   PHYSICIAN:  Pramod P. Pearlean Brownie, MD    DATE OF BIRTH:  [birth year]   DATE OF CONSULTATION:  07/16/2005  DATE OF DISCHARGE:                                   CONSULTATION   REFERRING PHYSICIAN:  Balinda Quails, M.D.   REASON FOR REFERRAL:  Confusion, dizziness and carotid stenosis.   HISTORY OF PRESENT ILLNESS:  Charles Castaneda is a 55 year old Caucasian male who  has been having mild confusion and dizziness and gait difficulties for last  couple of days.  The patient was recently evaluated by family physician, Dr.  Raul Del, in [place], [place], and was found to have greater than 80%  symptomatic right internal carotid artery stenosis.  He was referred to Dr.  Madilyn Fireman for possible surgical intervention and when he mentioned the symptoms  of dizziness and confusion at work today, was admitted to the hospital for  further evaluation.  The patient states that the last couple of days he has  had some trouble working at home as well as work.  He had trouble  remembering his blood pressure and diabetes medications and could not also  perform well at work.  He denied any headache, any slurred speech, focal  extremity weakness or numbness.  He has also been complaining of little bit  of dizziness for the last few days.  He does have diabetic peripheral  neuropathy but he feels his balance has been a little off for the last  couple of days.  The patient has had no documented history of stroke, TIA,  seizures or significant neurological problems in the past.  He has had  multiple episodes of fainting which have been related to hypoglycemia in the  past.   PAST MEDICAL HISTORY:  1.  Diabetes for 30+ years with retinopathy, neuropathy, nephropathy and      gastroparesis.  2.  Hypertension.  3.  Hyperlipidemia.  4.   Hypothyroidism.  5.  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   PAST SURGICAL HISTORY:  1.  Cholecystectomy.  2.  Partial thyroidectomy.  3.  Vitrectomy in his eyes.   ALLERGIES:  PENICILLIN.   HOME MEDICATIONS:  1.  Aspirin.  2.  Benicar.  3.  Metoprolol.  4.  Hydrochlorothiazide.  5.  Felodipine.  6.  Levoxyl.  7.  AcipHex.  8.  Metoclopramide.  9.  Xanax.  10. Lipitor.  11. Multivitamin.  12. Lantus insulin.  13. Humalog insulin.  14. Stool softener.   SOCIAL HISTORY:  The patient is single and lives alone.  He smokes cigars  occasionally and does not drink alcohol.  He denies doing drugs.  [place]   FAMILY HISTORY:  Significant for father with a stroke.   REVIEW OF SYSTEMS:  As stated above.   PHYSICAL EXAMINATION:  GENERAL:  A pleasant young Caucasian male not in  distress.  VITAL SIGNS:  Afebrile, pulse of 78, regular, respiratory rate 16 per  minute, distal pulses well felt.  HEENT:  Head is nontraumatic.  ENT unremarkable  NECK:  Supple without bruit.  CARDIOVASCULAR:  No murmur or gallop.  LUNGS:  Clear to auscultation.  ABDOMEN:  Soft, nontender.  NEUROLOGICAL:  The patient is pleasant, awake, alert, cooperative.  There is  no aphasia, apraxia or dysarthria.  He has intact attention, registration,  his recall is 3/3.  Does not have an distractibility.  He is oriented to  place and person.  He can recall the last 5 years' presidents.  Eye  movements are full range without nystagmus.  He is legally blind in the left  eye with minimum finger counting at a few feet.  Face is symmetric.  Palatal  movements are normal.  Tongue is midline.  Motor system exam reveals  symmetric upper and lower extremity strength, tone, reflexes, including  ankle jerk, plantars are downgoing.  He has subjective diminished touch,  pinprick sensation in the lower extremities from the knees down.  Vibration  and position sense are diminished from  ankle down.  He walks with a slow but  steady gait.  He is unsteady on narrow base and is unable to walk tandem.   DATA REVIEWED:  Admission labs are pending at this time.  Carotid ultrasound  done in Dr. Gretel Acre office on Jul 13, 2004, apparently shows greater than  80% right ICA stenosis and 40-60% left ICA stenosis.   IMPRESSION:  A 55 year old gentleman with mild confusion for the last couple  of days of unclear etiology.  He also has had some increasing dizziness  which perhaps may be related to his increased dose of metoprolol which has  been increased from 50 to 150 mg a day in the last couple of weeks.  He has  symptomatic severe right internal carotid artery stenosis.  Significant  vascular risk factors of diabetes, hypertension, hyperlipidemia.   PLAN:  I would recommend further evaluation with checking complete metabolic  panel labs, CBC, B12, TSH, UA.  Also check fasting lipid profile,  homocystine and hemoglobin A1c.  MRI scan of the brain with MRA of the brain  and neck.  Further recommendations based on results of the above tests. I  will be happy to follow the patient, kindly call for questions.           ______________________________  Sunny Schlein. Pearlean Brownie, MD    PPS/MEDQ  D:  07/16/2005  T:  07/17/2005  Job:  [id number]

## 2010-07-25 NOTE — H&P (Signed)
Charles Castaneda, Charles Castaneda                 ACCOUNT NO.:  [id number]   MEDICAL RECORD NO.:  [id number]          PATIENT TYPE:  OIB   LOCATION:  2899                         FACILITY:  [facility]   PHYSICIAN:  Charlies Constable, M.D. [facility] DATE OF BIRTH:  10/04/1955   DATE OF ADMISSION:  03/04/2004  DATE OF DISCHARGE:                                HISTORY & PHYSICAL   PRIMARY CARE PHYSICIAN:  Dr. Nena Jordan in [place], [place].   PRIMARY CARDIOLOGIST:  He is new to the practice, but prefers followup in  [place].   CHIEF COMPLAINT:  Abnormal Cardiolite.   HISTORY OF PRESENT ILLNESS:  Charles Castaneda is a 55 year old male with no known  history of coronary artery disease.  He had some abdominal pain and  eventually it was decided that he needed a cholecystectomy.  However,  because of some fatigue and weakness he had been having, his primary care  physician and the surgeon felt that a Cardiolite was indicated.  The  Cardiolite was positive for ischemia in the anterior wall and his EF was  66%.  He was referred for cath.   Charles Castaneda never gets chest pain.  He does state, however, that he can only  walk about 50 yards without stopping because of shortness of breath.  He  also has significant fatigue.  He states he can climb 1 flight of steps  without stopping, but not more than that.  He states that his symptoms have  become gradually worse.   PAST MEDICAL HISTORY:  Past medical history is significant for insulin-  dependent diabetes, hypertension and hyperlipidemia.  He has a history of  neuropathy and a history of hyperthyroidism, status post surgery and now  hypothyroid.  He describes gastroparesis and nephropathy, but we have no  records available for review.  His hemoglobin A1c was 7.9 in October of  2005.   SURGICAL HISTORY:  He is status post bilateral vitrectomies, thyroidectomy  and has had knee surgery.   ALLERGIES:  PENICILLIN.   CURRENT MEDICATIONS:  Current medications include:  1.   Levoxyl 0.15 mg daily.  2.  Benicar 20 mg daily.  3.  Metoprolol 50 mg once daily.  4.  Hydrochlorothiazide 25 mg three-quarters of a tablet daily.  5.  Aspirin 81 mg daily.  6.  Multivitamin daily.  7.  Lantus 56 units q.p.m. as well as Humalog sliding scale a.c. and h.s.  8.  Reglan 10 mg p.r.n.   SOCIAL HISTORY:  He lives in [place] with his mother and is disabled secondary  to diabetes.  He smokes a rare cigar, but does not otherwise use tobacco and  does not use alcohol or drugs.   FAMILY HISTORY:  His mother is alive at age 36 with no history of heart  disease.  His father died at age 40 with COPD and an enlarged heart, but no  history of heart disease.  He has 2 older brothers, none of whom have heart  disease.  He is the only one in his immediate family with diabetes.   REVIEW OF SYSTEMS:  Review of systems is significant for occasional sweats,  but he denies any recent fevers, chills or illness.  The shortness of breath  and dyspnea on exertion are described above.  He has occasional tachy-  palpitations and occasional cough.  He denies any genitourinary symptoms.  He has arthralgias in his shoulders and occasional leg cramps.  He has  occasional constipation and abdominal pain.  His TSH was checked in June of  2005 and no medication changes were made at that time.   PHYSICAL EXAM:  VITAL SIGNS:  Temperature is 98.2, blood pressure 199/95,  heart rate 83, respiratory rate 20, O2 saturation 94% on room air.  GENERAL:  He is a well-developed, well-nourished white male in no acute  distress.  HEENT:  His head is normocephalic and atraumatic with extraocular movements  intact.  Sclerae are clear.  Nares without discharge.  NECK:  Is supple and no lymphadenopathy, thyromegaly, bruit or JVD are  noted.  CV:  His heart is regular in rate and rhythm with an S1 and S2, and no  significant murmur, rub or gallop.  His distal pulses are 2+ and no femoral  bruits are appreciated.   LUNGS:  Are clear to auscultation bilaterally.  SKIN:  No rashes or lesions are noted.  ABDOMEN:  Is soft with active bowel sounds and he has slight right upper  quadrant tenderness, but no guarding.  EXTREMITIES:  There is no cyanosis, clubbing or edema.  MUSCULOSKELETAL:  There is no joint deformity or effusions and no spine or  CVA tenderness is noted.  NEUROLOGIC:  He is alert and oriented with cranial nerves II-XII grossly  intact.   LABORATORY AND ACCESSORY CLINICAL DATA:  Chest x-ray, EKG and labs are all  pending at the time of dictation.   ASSESSMENT AND PLAN:  1.  Abnormal Cardiolite:  The patient is for cardiac catheterization today.      His laboratories will be reviewed and he may need a bicarbonate drip,      depending on his BUN and creatinine.  __________ kidney function.  2.  Hypertension:  I received records from Dr. Gretel Acre office and his      Benicar has been increased to 40 mg a day for better blood pressure      control.  His systolic blood pressure is generally about 150 at home,      according to the patient.  Dr. Raul Del felt that if Charles Castaneda remained      hypertensive, he might need felodipine.  We will discuss with medical      doctor.  3.  Diabetes:  He is followed closely by Dr. Raul Del and will continue on his      home medications plus sliding scale.       RB/MEDQ  D:  03/04/2004  T:  03/04/2004  Job:  [id number]

## 2011-05-14 IMAGING — XA IR FLUORO GUIDE CV LINE*R*
1 series · 1 of 1 positions shown · non-contrast
Comparison: none

CLINICAL DATA: Wound infection and needs IV antibiotics.

UPPER EXTREMITY PICC PLACEMENT WITH ULTRASOUND AND FLUORO GUIDANCE
TECHNIQUE: The right arm was prepped with chlorhexidine, draped in
the usual sterile fashion using maximum barrier technique and
infiltrated locally with 1% Lidocaine.  Ultrasound demonstrated
patency of the right basilic vein.  Under real-time ultrasound
guidance, this vein was accessed with a 21 gauge micropuncture
needle.  Ultrasound image documentation was performed.  The needle
was exchanged over a guidewire for a peel-away sheath through which
a 5 French single lumen PICC trimmed to 42cm was advanced,
positioned with its tip at the distal SVC/right atrial junction.
Fluoroscopy during the procedure and fluoro spot radiograph
confirms appropriate catheter position.  The catheter was flushed,
secured to the skin with Prolene sutures, and covered with a
sterile dressing.  No immediate complication.

[Series 1: run · 1 of 1 slices shown]
[im 1/1]
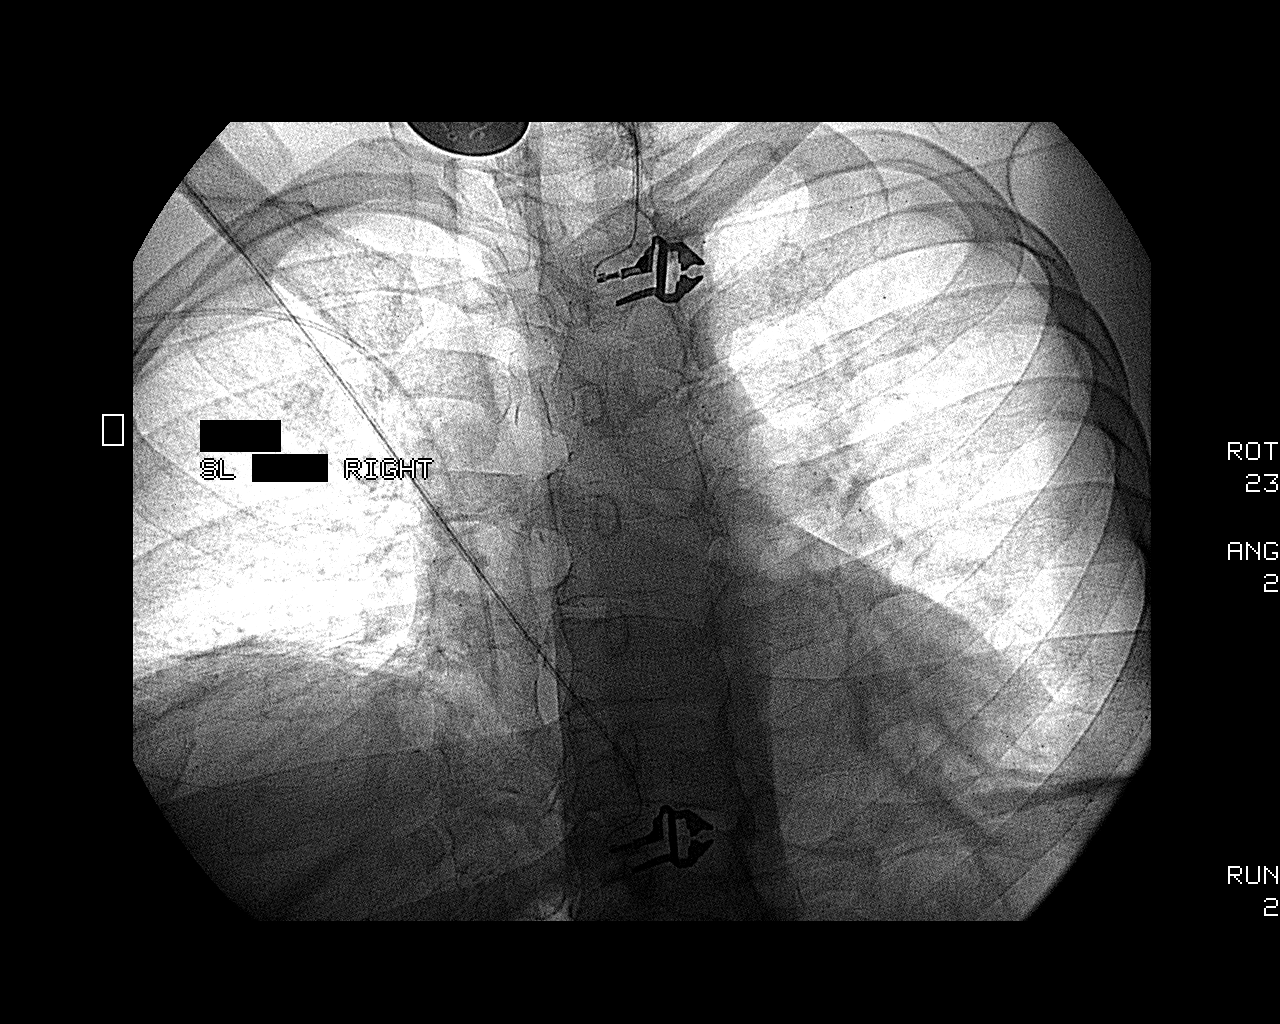

[1 of 1 positions shown; findings below may reference images not displayed]

The left arm was accessed prior to the right arm.  A catheter could
not be easily advanced from either the left basilic or left
brachial vein.  Findings raise concern for obstruction in the left
upper extremity near the axilla.  A contrast venogram was not
performed because the patient's creatinine level was unknown.
IMPRESSION: Technically successful right arm PICC placement with ultrasound and
fluoroscopic guidance.  The catheter is ready for use.

Possible central venous stenosis or obstruction in the left
axillary region.

## 2012-02-22 ENCOUNTER — Encounter: Payer: Self-pay | Admitting: Vascular Surgery

## 2012-02-23 ENCOUNTER — Encounter: Payer: Self-pay | Admitting: Vascular Surgery

## 2012-02-23 ENCOUNTER — Other Ambulatory Visit: Payer: Self-pay | Admitting: *Deleted

## 2012-02-23 ENCOUNTER — Ambulatory Visit (INDEPENDENT_AMBULATORY_CARE_PROVIDER_SITE_OTHER): Payer: Medicare Other | Admitting: Vascular Surgery

## 2012-02-23 ENCOUNTER — Encounter (INDEPENDENT_AMBULATORY_CARE_PROVIDER_SITE_OTHER): Payer: Medicare Other | Admitting: *Deleted

## 2012-02-23 VITALS — BP 195/95 | HR 80 | Resp 18 | Ht 69.0 in | Wt 231.0 lb

## 2012-02-23 DIAGNOSIS — L97909 Non-pressure chronic ulcer of unspecified part of unspecified lower leg with unspecified severity: Secondary | ICD-10-CM

## 2012-02-23 DIAGNOSIS — I739 Peripheral vascular disease, unspecified: Secondary | ICD-10-CM

## 2012-02-23 DIAGNOSIS — L98499 Non-pressure chronic ulcer of skin of other sites with unspecified severity: Secondary | ICD-10-CM | POA: Insufficient documentation

## 2012-02-23 DIAGNOSIS — L0390 Cellulitis, unspecified: Secondary | ICD-10-CM

## 2012-02-23 NOTE — Progress Notes (Signed)
Subjective:     Patient ID: Charles Castaneda, male   DOB: 12/21/1955, 56 y.o.   MRN: [id number]  HPI this 56 year old male with type 1 diabetes mellitus for the past 40 years was recently hospitalized for cellulitis in the left foot by Dr. Sherril Croon. The cellulitis has been improving. Patient had some small ulcerations on the foot. He has a history of a pressure sore on the right heel which healed. He does have peripheral neuropathy but is able to ambulate without much difficulty. Recent ABIs were 0.73 on the right and 0.89 on the left.  Past Medical History  Diagnosis Date  . Thyroid disease   . Carotid artery occlusion   . Hypertension     History  Substance Use Topics  . Smoking status: Former Smoker    Types: Pipe, Cigars    Quit date: 02/22/2006  . Smokeless tobacco: Never Used  . Alcohol Use: No    Family History  Problem Relation Age of Onset  . Cancer Mother   . COPD Father   . Heart disease Father     Allergies  Allergen Reactions  . Penicillins   . Sulphadimidine (Sulfamethazine)     Current outpatient prescriptions:aluminum-magnesium hydroxide 200-200 MG/5ML suspension, Take by mouth every 6 (six) hours as needed., Disp: , Rfl: ;  aspirin 325 MG EC tablet, Take 325 mg by mouth daily., Disp: , Rfl: ;  Eszopiclone (ESZOPICLONE) 3 MG TABS, Take 3 mg by mouth at bedtime. Take immediately before bedtime, Disp: , Rfl: ;  furosemide (LASIX) 40 MG tablet, Take 40 mg by mouth daily., Disp: , Rfl:  insulin glargine (LANTUS) 100 UNIT/ML injection, Inject into the skin at bedtime., Disp: , Rfl: ;  insulin lispro (HUMALOG KWIKPEN) 100 UNIT/ML injection, Inject into the skin as needed., Disp: , Rfl: ;  Krill Oil 300 MG CAPS, Take 300 mg by mouth daily., Disp: , Rfl: ;  levothyroxine (SYNTHROID, LEVOTHROID) 175 MCG tablet, Take 175 mcg by mouth daily., Disp: , Rfl:  metoprolol succinate (TOPROL-XL) 50 MG 24 hr tablet, Take 50 mg by mouth daily. Take with or immediately following a meal., Disp:  , Rfl: ;  Multiple Vitamin (MULTIVITAMIN) tablet, Take 1 tablet by mouth daily., Disp: , Rfl: ;  niacin 100 MG tablet, Take 100 mg by mouth daily with breakfast., Disp: , Rfl: ;  omeprazole (PRILOSEC) 20 MG capsule, Take 20 mg by mouth daily., Disp: , Rfl:  psyllium (REGULOID) 0.52 G capsule, Take 0.52 g by mouth daily., Disp: , Rfl: ;  Tamsulosin HCl (FLOMAX) 0.4 MG CAPS, Take 0.4 mg by mouth daily., Disp: , Rfl: ;  zinc gluconate 50 MG tablet, Take 50 mg by mouth daily., Disp: , Rfl: ;  zolpidem (AMBIEN) 5 MG tablet, Take 5 mg by mouth at bedtime as needed., Disp: , Rfl:   BP 195/95  Pulse 80  Resp 18  Ht 5\' 9"  (1.753 m)  Wt 231 lb (104.781 kg)  BMI 34.11 kg/m2  Body mass index is 34.11 kg/(m^2).           Review of Systems denies chest pain but has had previous cardiac catheterization. Does complain of mild dyspnea on exertion, history of DVT, swelling in lower extremities. Other systems negative other than peripheral neuropathy.     Objective:   Physical Exam blood pressure 100/95 heart rate 80 respirations 18 Gen.-alert and oriented x3 in no apparent distress HEENT normal for age Lungs no rhonchi or wheezing Cardiovascular regular rhythm no murmurs  carotid pulses 3+ palpable no bruits audible Abdomen soft nontender no palpable masses Musculoskeletal free of  major deformities Skin clear -no rashes Neurologic normal Lower extremities 3+ femoral and 2+ dorsalis pedis pulses palpable bilaterally. Both feet pink and well perfused. Left foot has some erythema distally with a few small fissures  on the plantar aspect of the foot-no gangrene is noted. Right lower extremity has a healed ulcer in the dependent position on the heel. Decreased sensation in both feet.  Today I reviewed the lower extremity arterial Doppler report provided by [facility]. I ordered a lower extremity duplex scan which are reviewed and interpreted. There is in-line flow to the left foot. 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

## 2012-07-11 DIAGNOSIS — Z0000 Encounter for general adult medical examination without abnormal findings: Secondary | ICD-10-CM

## 2012-07-14 ENCOUNTER — Ambulatory Visit (HOSPITAL_COMMUNITY)
Admission: RE | Admit: 2012-07-14 | Discharge: 2012-07-14 | Disposition: A | Discharge status: Home / Self Care | Payer: Medicare Other | Source: Ambulatory Visit | Attending: Internal Medicine | Admitting: Internal Medicine

## 2012-07-14 DIAGNOSIS — L02619 Cutaneous abscess of unspecified foot: Secondary | ICD-10-CM | POA: Insufficient documentation

## 2012-07-14 MED ORDER — SODIUM CHLORIDE 0.9 % IJ SOLN: 10.0000 mL | Freq: Two times a day (BID) | INTRAMUSCULAR | Status: DC

## 2012-07-14 MED ORDER — SODIUM CHLORIDE 0.9 % IJ SOLN: 10.0000 mL | INTRAMUSCULAR | Status: DC | PRN

## 2012-07-14 NOTE — Progress Notes (Signed)
picc line placement single lumen for long term antibiotics for left foot cellulitis

## 2013-01-25 ENCOUNTER — Other Ambulatory Visit: Payer: Self-pay | Admitting: Vascular Surgery

## 2013-01-25 ENCOUNTER — Ambulatory Visit (INDEPENDENT_AMBULATORY_CARE_PROVIDER_SITE_OTHER): Payer: Medicare Other | Admitting: Vascular Surgery

## 2013-01-25 ENCOUNTER — Encounter: Payer: Self-pay | Admitting: Vascular Surgery

## 2013-01-25 ENCOUNTER — Ambulatory Visit (HOSPITAL_COMMUNITY)
Admission: RE | Admit: 2013-01-25 | Discharge: 2013-01-25 | Disposition: A | Discharge status: Home / Self Care | Payer: Medicare Other | Source: Ambulatory Visit | Attending: Vascular Surgery | Admitting: Vascular Surgery

## 2013-01-25 VITALS — BP 156/76 | HR 89 | Ht 69.0 in | Wt 220.8 lb

## 2013-01-25 DIAGNOSIS — I7025 Atherosclerosis of native arteries of other extremities with ulceration: Secondary | ICD-10-CM | POA: Insufficient documentation

## 2013-01-25 DIAGNOSIS — I739 Peripheral vascular disease, unspecified: Secondary | ICD-10-CM | POA: Insufficient documentation

## 2013-01-25 DIAGNOSIS — L98499 Non-pressure chronic ulcer of skin of other sites with unspecified severity: Secondary | ICD-10-CM

## 2013-01-25 DIAGNOSIS — L97509 Non-pressure chronic ulcer of other part of unspecified foot with unspecified severity: Secondary | ICD-10-CM

## 2013-01-25 NOTE — Progress Notes (Signed)
 [facility] [place]  Patient name: Charles Castaneda MRN: [id number] DOB: 02/10/1956 Sex: male  REASON FOR VISIT: Follow up of peripheral vascular disease with nonhealing ulcers. He was referred from the wound care center to be seen as an add-on.   HPI: Charles Castaneda is a 57 y.o. male who was seen in consultation by Dr. Lawson on 02/23/2012 with cellulitis in the left foot and an ulceration on the left foot. Previously he had an ulcer on his right heel which ultimately healed. Doppler studies at that time showed an ABI of 73% on the right and 89% on the left. It was noted that he had triphasic flow in both feet, and Dr. Lawson felt that he had adequate circulation to heal the areas on his left foot.  He had been doing well until approximately 3 weeks ago when he developed a wound on the lateral aspect of his right foot and also on the left heel. In reviewing the records from the [place] wound healing center, it appears that these wounds were related to severe fluid retention. This was being treated with compression stockings. In addition the wounds were being offloaded. He was seen in the wound care center today with [facility] grade 2 diabetic foot ulcers bilaterally. Given the extensive nature of the wound he was sent for urgent vascular consultation.  I do not get any clear-cut history of claudication or rest pain. 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 He states he's had problems with swelling in both legs on and off since 2007.  From a vascular standpoint, he has also undergone a previous right carotid endarterectomy by Dr. Greg Hayes in 2007.  Past Medical History  Diagnosis Date  . Thyroid disease   . Carotid artery occlusion   . Hypertension   . Diabetes mellitus without complication    Family History  Problem Relation Age of Onset  . Cancer Mother   . Deep vein thrombosis Mother   . Heart disease Mother     before age 60  . Varicose  Veins Mother   . COPD Father   . Heart disease Father   . Deep vein thrombosis Father   . Diabetes Father   . Hyperlipidemia Father   . Hypertension Father   . Varicose Veins Father   . AAA (abdominal aortic aneurysm) Father   . Cancer Brother   . Deep vein thrombosis Brother   . Peripheral vascular disease Brother    SOCIAL HISTORY: History  Substance Use Topics  . Smoking status: Former Smoker    Types: Pipe, Cigars    Quit date: 02/22/2006  . Smokeless tobacco: Never Used  . Alcohol Use: No   Allergies  Allergen Reactions  . Penicillins   . Sulphadimidine [Sulfamethazine]    Current Outpatient Prescriptions  Medication Sig Dispense Refill  . aluminum-magnesium hydroxide 200-200 MG/5ML suspension Take by mouth every 6 (six) hours as needed.      . aspirin 325 MG EC tablet Take 325 mg by mouth daily.      . Eszopiclone (ESZOPICLONE) 3 MG TABS Take 3 mg by mouth at bedtime. Take immediately before bedtime      . furosemide (LASIX) 40 MG tablet Take 40 mg by mouth daily.      . insulin glargine (LANTUS) 100 UNIT/ML injection Inject into the skin at bedtime.      . insulin lispro (HUMALOG KWIKPEN) 100 UNIT/ML injection Inject into the skin as   needed.      . Krill Oil 300 MG CAPS Take 300 mg by mouth daily.      . levothyroxine (SYNTHROID, LEVOTHROID) 175 MCG tablet Take 175 mcg by mouth daily.      . metoprolol succinate (TOPROL-XL) 50 MG 24 hr tablet Take 50 mg by mouth daily. Take with or immediately following a meal.      . Multiple Vitamin (MULTIVITAMIN) tablet Take 1 tablet by mouth daily.      . niacin 100 MG tablet Take 100 mg by mouth daily with breakfast.      . omeprazole (PRILOSEC) 20 MG capsule Take 20 mg by mouth daily.      . psyllium (REGULOID) 0.52 G capsule Take 0.52 g by mouth daily.      . Tamsulosin HCl (FLOMAX) 0.4 MG CAPS Take 0.4 mg by mouth daily.      . zinc gluconate 50 MG tablet Take 50 mg by mouth daily.      . zolpidem (AMBIEN) 5 MG tablet Take 5  mg by mouth at bedtime as needed.       No current facility-administered medications for this visit.   REVIEW OF SYSTEMS: [X ] denotes positive finding; [  ] denotes negative finding  CARDIOVASCULAR:  [ ] chest pain   [ ] chest pressure   [ ] palpitations   [ ] orthopnea   [X ] dyspnea on exertion   [ ] claudication   [ ] rest pain   [ ] DVT   [ ] phlebitis PULMONARY:   [ ] productive cough   [ ] asthma   [ ] wheezing NEUROLOGIC:   [ ] weakness  [ ] paresthesias  [ ] aphasia  [ ] amaurosis  [ ] dizziness HEMATOLOGIC:   [ ] bleeding problems   [ ] clotting disorders MUSCULOSKELETAL:  [ ] joint pain   [ ] joint swelling [ ] leg swelling GASTROINTESTINAL: [ ]  blood in stool  [ ]  hematemesis GENITOURINARY:  [ ]  dysuria  [ ]  hematuria PSYCHIATRIC:  [ ] history of major depression INTEGUMENTARY:  [X ] rashes  [X ] ulcers CONSTITUTIONAL:  [ ] fever   [ ] chills  PHYSICAL EXAM: Filed Vitals:   01/25/13 1620  BP: 156/76  Pulse: 89  Height: 5' 9" (1.753 m)  Weight: 220 lb 12.8 oz (100.154 kg)  SpO2: 97%   Body mass index is 32.59 kg/(m^2). GENERAL: The patient is a well-nourished male, in no acute distress. The vital signs are documented above. CARDIOVASCULAR: There is a regular rate and rhythm. I do not detect carotid bruits. He has palpable femoral pulses. Because of his leg swelling, I cannot palpate popliteal or pedal pulses. He has significant bilateral lower extremity swelling. PULMONARY: There is good air exchange bilaterally without wheezing or rales. ABDOMEN: Soft and non-tender with normal pitched bowel sounds.  NEUROLOGIC: No focal weakness or paresthesias are detected. SKIN: He has a full thickness wound on the lateral aspect on the plantar aspect of his right foot related to a pressure sore. In addition his left heel was debrided at the wound care center today and he has an extensive full-thickness wound on the left heel with reasonable granulation tissue. PSYCHIATRIC: The  patient has a normal affect.  DATA:  I have reviewed the records from the wound care center as described above.  I have independently interpreted his arterial Doppler study today. He was noted to have   a biphasic Doppler signals in the dorsalis pedis and posterior tibial positions bilaterally. ABI was 100% bilaterally. Toe pressure on the right was 99 mmHg. Toe pressure on the left was 86 mm of mercury.  MEDICAL ISSUES:  Atherosclerosis of native arteries of the extremities with ulceration(440.23) The patient has an extensive heel ulcer on the left and also a deep wound on the plantar aspect of his right foot. Given his history of diabetes and the risk of nonhealing I have recommended arteriography to be sure that he has adequate circulation for healing. His noninvasive Doppler studies to just adequate circulation for healing, however, these ABIs may be falsely elevated because of calcific disease. In addition I cannot palpate pedal pulses because of his swelling. I have reviewed with the patient the indications for arteriography. In addition, I have reviewed the potential complications of arteriography including but not limited to: Bleeding, arterial injury, arterial thrombosis, dye action, renal insufficiency, or other unpredictable medical problems. I have explained to the patient that if we find disease amenable to angioplasty we could potentially address this at the same time. I have discussed the potential complications of angioplasty and stenting, including but not limited to: Bleeding, arterial thrombosis, arterial injury, dissection, or the need for surgical intervention. We will make further recommendations pending the results of his arteriogram which is scheduled for 01/30/2013.   Rakayla Ricklefs S [facility] [place] Beeper: [phone]    

## 2013-01-25 NOTE — Assessment & Plan Note (Signed)
The patient has an extensive heel ulcer on the left and also a deep wound on the plantar aspect of his right foot. Given his history of diabetes and the risk of nonhealing I have recommended arteriography to be sure that he has adequate circulation for healing. His noninvasive Doppler studies to just adequate circulation for healing, however, these ABIs may be falsely elevated because of calcific disease. In addition I cannot palpate pedal pulses because of his swelling. I have reviewed with the patient the indications for arteriography. In addition, I have reviewed the potential complications of arteriography including but not limited to: Bleeding, arterial injury, arterial thrombosis, dye action, renal insufficiency, or other unpredictable medical problems. I have explained to the patient that if we find disease amenable to angioplasty we could potentially address this at the same time. I have discussed the potential complications of angioplasty and stenting, including but not limited to: Bleeding, arterial thrombosis, arterial injury, dissection, or the need for surgical intervention. We will make further recommendations pending the results of his arteriogram which is scheduled for 01/30/2013.

## 2013-01-26 ENCOUNTER — Encounter (HOSPITAL_COMMUNITY): Payer: Self-pay | Admitting: Pharmacy Technician

## 2013-01-26 ENCOUNTER — Other Ambulatory Visit: Payer: Self-pay

## 2013-01-30 ENCOUNTER — Ambulatory Visit (HOSPITAL_COMMUNITY)
Admission: RE | Admit: 2013-01-30 | Discharge: 2013-01-31 | Disposition: A | Discharge status: Home / Self Care | Payer: Medicare Other | Source: Ambulatory Visit | Attending: Vascular Surgery | Admitting: Vascular Surgery

## 2013-01-30 ENCOUNTER — Encounter (HOSPITAL_COMMUNITY)
Admission: RE | Disposition: A | Discharge status: Home / Self Care | Payer: Self-pay | Source: Ambulatory Visit | Attending: Vascular Surgery

## 2013-01-30 ENCOUNTER — Telehealth: Payer: Self-pay | Admitting: Vascular Surgery

## 2013-01-30 DIAGNOSIS — I739 Peripheral vascular disease, unspecified: Secondary | ICD-10-CM | POA: Insufficient documentation

## 2013-01-30 DIAGNOSIS — E079 Disorder of thyroid, unspecified: Secondary | ICD-10-CM | POA: Insufficient documentation

## 2013-01-30 DIAGNOSIS — I10 Essential (primary) hypertension: Secondary | ICD-10-CM | POA: Insufficient documentation

## 2013-01-30 DIAGNOSIS — Z87891 Personal history of nicotine dependence: Secondary | ICD-10-CM | POA: Insufficient documentation

## 2013-01-30 DIAGNOSIS — I6529 Occlusion and stenosis of unspecified carotid artery: Secondary | ICD-10-CM | POA: Insufficient documentation

## 2013-01-30 DIAGNOSIS — L02619 Cutaneous abscess of unspecified foot: Secondary | ICD-10-CM | POA: Insufficient documentation

## 2013-01-30 DIAGNOSIS — E119 Type 2 diabetes mellitus without complications: Secondary | ICD-10-CM | POA: Insufficient documentation

## 2013-01-30 DIAGNOSIS — L98499 Non-pressure chronic ulcer of skin of other sites with unspecified severity: Secondary | ICD-10-CM

## 2013-01-30 HISTORY — PX: ABDOMINAL AORTAGRAM: SHX5454

## 2013-01-30 LAB — POCT I-STAT, CHEM 8
BUN: 12 mg/dL (ref 6–23)
Calcium, Ion: 1.15 mmol/L (ref 1.12–1.23)
Chloride: 100 mEq/L (ref 96–112)
Creatinine, Ser: 1.1 mg/dL (ref 0.50–1.35)
Glucose, Bld: 293 mg/dL — ABNORMAL HIGH (ref 70–99)
Sodium: 138 mEq/L (ref 135–145)

## 2013-01-30 LAB — GLUCOSE, CAPILLARY
Glucose-Capillary: 274 mg/dL — ABNORMAL HIGH (ref 70–99)
Glucose-Capillary: 222 mg/dL — ABNORMAL HIGH (ref 70–99)
Glucose-Capillary: 165 mg/dL — ABNORMAL HIGH (ref 70–99)

## 2013-01-30 SURGERY — ABDOMINAL AORTAGRAM
Anesthesia: LOCAL

## 2013-01-30 MED ORDER — ONDANSETRON HCL 4 MG/2ML IJ SOLN: 4.0000 mg | Freq: Four times a day (QID) | INTRAMUSCULAR | Status: DC | PRN

## 2013-01-30 MED ORDER — LORATADINE 10 MG PO TABS
10.0000 mg | ORAL_TABLET | Freq: Every day | ORAL | Status: DC
  Administered 2013-01-30 – 2013-01-31 (×2): 10 mg via ORAL
  Filled 2013-01-30 (×2): qty 1

## 2013-01-30 MED ORDER — PANTOPRAZOLE SODIUM 40 MG PO TBEC
40.0000 mg | DELAYED_RELEASE_TABLET | Freq: Every day | ORAL | Status: DC
  Administered 2013-01-31: 40 mg via ORAL
  Filled 2013-01-30: qty 1

## 2013-01-30 MED ORDER — ZINC GLUCONATE 50 MG PO TABS: 50.0000 mg | ORAL_TABLET | Freq: Every day | ORAL | Status: DC

## 2013-01-30 MED ORDER — METOPROLOL TARTRATE 50 MG PO TABS
75.0000 mg | ORAL_TABLET | Freq: Two times a day (BID) | ORAL | Status: DC
  Administered 2013-01-30 – 2013-01-31 (×2): 75 mg via ORAL
  Filled 2013-01-30 (×5): qty 1

## 2013-01-30 MED ORDER — SODIUM CHLORIDE 0.9 % IV SOLN: 1.0000 mL/kg/h | INTRAVENOUS | Status: DC

## 2013-01-30 MED ORDER — PSYLLIUM 95 % PO PACK
1.0000 | PACK | Freq: Every day | ORAL | Status: DC
  Administered 2013-01-30: 1 via ORAL
  Filled 2013-01-30 (×2): qty 1

## 2013-01-30 MED ORDER — NIACIN 100 MG PO TABS
100.0000 mg | ORAL_TABLET | Freq: Every day | ORAL | Status: DC
  Filled 2013-01-30 (×2): qty 1

## 2013-01-30 MED ORDER — CHOLECALCIFEROL 10 MCG (400 UNIT) PO TABS
400.0000 [IU] | ORAL_TABLET | Freq: Every day | ORAL | Status: DC
  Administered 2013-01-30 – 2013-01-31 (×2): 400 [IU] via ORAL
  Filled 2013-01-30 (×2): qty 1

## 2013-01-30 MED ORDER — TRIAMCINOLONE ACETONIDE 0.1 % EX CREA
1.0000 "application " | TOPICAL_CREAM | Freq: Two times a day (BID) | CUTANEOUS | Status: DC
  Administered 2013-01-30 – 2013-01-31 (×3): 1 via TOPICAL
  Filled 2013-01-30: qty 15

## 2013-01-30 MED ORDER — LIDOCAINE HCL (PF) 1 % IJ SOLN
INTRAMUSCULAR | Status: AC
  Filled 2013-01-30: qty 30

## 2013-01-30 MED ORDER — KRILL OIL 300 MG PO CAPS: 300.0000 mg | ORAL_CAPSULE | Freq: Every day | ORAL | Status: DC

## 2013-01-30 MED ORDER — ACETAMINOPHEN 325 MG PO TABS: 650.0000 mg | ORAL_TABLET | ORAL | Status: DC | PRN

## 2013-01-30 MED ORDER — HEPARIN (PORCINE) IN NACL 2-0.9 UNIT/ML-% IJ SOLN
INTRAMUSCULAR | Status: AC
  Filled 2013-01-30: qty 1000

## 2013-01-30 MED ORDER — LABETALOL HCL 5 MG/ML IV SOLN: 10.0000 mg | INTRAVENOUS | Status: DC | PRN

## 2013-01-30 MED ORDER — ZOLPIDEM TARTRATE 5 MG PO TABS: 5.0000 mg | ORAL_TABLET | Freq: Every evening | ORAL | Status: DC | PRN

## 2013-01-30 MED ORDER — VITAMIN B-12 1000 MCG PO TABS
1000.0000 ug | ORAL_TABLET | Freq: Every day | ORAL | Status: DC
  Administered 2013-01-30 – 2013-01-31 (×2): 1000 ug via ORAL
  Filled 2013-01-30 (×2): qty 1

## 2013-01-30 MED ORDER — LEVOTHYROXINE SODIUM 200 MCG PO TABS
200.0000 ug | ORAL_TABLET | Freq: Every day | ORAL | Status: DC
  Administered 2013-01-31: 200 ug via ORAL
  Filled 2013-01-30 (×2): qty 1

## 2013-01-30 MED ORDER — FENTANYL CITRATE 0.05 MG/ML IJ SOLN
INTRAMUSCULAR | Status: AC
  Filled 2013-01-30: qty 2

## 2013-01-30 MED ORDER — ASPIRIN EC 325 MG PO TBEC
325.0000 mg | DELAYED_RELEASE_TABLET | Freq: Every day | ORAL | Status: DC
  Administered 2013-01-31: 325 mg via ORAL
  Filled 2013-01-30: qty 1

## 2013-01-30 MED ORDER — BENZOCAINE 10 % MT GEL
1.0000 "application " | OROMUCOSAL | Status: DC | PRN
  Filled 2013-01-30: qty 9.4

## 2013-01-30 MED ORDER — MIDAZOLAM HCL 2 MG/2ML IJ SOLN
INTRAMUSCULAR | Status: AC
  Filled 2013-01-30: qty 2

## 2013-01-30 MED ORDER — ASPIRIN EC 325 MG PO TBEC: 325.0000 mg | DELAYED_RELEASE_TABLET | Freq: Every day | ORAL | Status: DC

## 2013-01-30 MED ORDER — TAMSULOSIN HCL 0.4 MG PO CAPS
0.4000 mg | ORAL_CAPSULE | Freq: Every day | ORAL | Status: DC
  Administered 2013-01-30 – 2013-01-31 (×2): 0.4 mg via ORAL
  Filled 2013-01-30 (×2): qty 1

## 2013-01-30 MED ORDER — INSULIN GLARGINE 100 UNIT/ML ~~LOC~~ SOLN
70.0000 [IU] | Freq: Every day | SUBCUTANEOUS | Status: DC
  Administered 2013-01-30: 70 [IU] via SUBCUTANEOUS
  Filled 2013-01-30 (×2): qty 0.7

## 2013-01-30 MED ORDER — HYDROCODONE-ACETAMINOPHEN 10-325 MG PO TABS
1.0000 | ORAL_TABLET | Freq: Four times a day (QID) | ORAL | Status: DC | PRN
  Administered 2013-01-30 – 2013-01-31 (×3): 1 via ORAL
  Filled 2013-01-30 (×3): qty 1

## 2013-01-30 MED ORDER — ZINC SULFATE 220 (50 ZN) MG PO CAPS
220.0000 mg | ORAL_CAPSULE | Freq: Every day | ORAL | Status: DC
  Administered 2013-01-30 – 2013-01-31 (×2): 220 mg via ORAL
  Filled 2013-01-30 (×2): qty 1

## 2013-01-30 MED ORDER — LABETALOL HCL 5 MG/ML IV SOLN
INTRAVENOUS | Status: AC
  Filled 2013-01-30: qty 4

## 2013-01-30 MED ORDER — PSYLLIUM 0.52 G PO CAPS: 0.5200 g | ORAL_CAPSULE | Freq: Every day | ORAL | Status: DC

## 2013-01-30 MED ORDER — OXYCODONE-ACETAMINOPHEN 5-325 MG PO TABS
1.0000 | ORAL_TABLET | ORAL | Status: DC | PRN
  Administered 2013-01-31: 1 via ORAL
  Filled 2013-01-30: qty 1

## 2013-01-30 MED ORDER — FUROSEMIDE 40 MG PO TABS
40.0000 mg | ORAL_TABLET | Freq: Every day | ORAL | Status: DC
  Administered 2013-01-30 – 2013-01-31 (×2): 40 mg via ORAL
  Filled 2013-01-30 (×2): qty 1

## 2013-01-30 MED ORDER — SODIUM CHLORIDE 0.9 % IV SOLN
INTRAVENOUS | Status: DC
  Administered 2013-01-30: 08:00:00 via INTRAVENOUS

## 2013-01-30 NOTE — H&P (View-Only) (Signed)
[facility] Specialist of [facility]  Patient name: Charles Castaneda MRN: [id number] DOB: 1955-06-22 Sex: male  REASON FOR VISIT: Follow up of peripheral vascular disease with nonhealing ulcers. He was referred from the wound care center to be seen as an add-on.   HPI: Charles Castaneda is a 57 y.o. male who was seen in consultation by Dr. Hart Rochester on 02/23/2012 with cellulitis in the left foot and an ulceration on the left foot. Previously he had an ulcer on his right heel which ultimately healed. Doppler studies at that time showed an ABI of 73% on the right and 89% on the left. It was noted that he had triphasic flow in both feet, and Dr. Hart Rochester felt that he had adequate circulation to heal the areas on his left foot.  He had been doing well until approximately 3 weeks ago when he developed a wound on the lateral aspect of his right foot and also on the left heel. In reviewing the records from the [facility] wound healing center, it appears that these wounds were related to severe fluid retention. This was being treated with compression stockings. In addition the wounds were being offloaded. He was seen in the wound care center today with [facility] grade 2 diabetic foot ulcers bilaterally. Given the extensive nature of the wound he was sent for urgent vascular consultation.  I do not get any clear-cut history of claudication or rest pain. 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 He states he's had problems with swelling in both legs on and off since 2007.  From a vascular standpoint, he has also undergone a previous right carotid endarterectomy by Dr. Liliane Bade in 2007.  Past Medical History  Diagnosis Date  . Thyroid disease   . Carotid artery occlusion   . Hypertension   . Diabetes mellitus without complication    Family History  Problem Relation Age of Onset  . Cancer Mother   . Deep vein thrombosis Mother   . Heart disease Mother     before age 63  . Varicose  Veins Mother   . COPD Father   . Heart disease Father   . Deep vein thrombosis Father   . Diabetes Father   . Hyperlipidemia Father   . Hypertension Father   . Varicose Veins Father   . AAA (abdominal aortic aneurysm) Father   . Cancer Brother   . Deep vein thrombosis Brother   . Peripheral vascular disease Brother    SOCIAL HISTORY: History  Substance Use Topics  . Smoking status: Former Smoker    Types: Pipe, Cigars    Quit date: 02/22/2006  . Smokeless tobacco: Never Used  . Alcohol Use: No   Allergies  Allergen Reactions  . Penicillins   . Sulphadimidine [Sulfamethazine]    Current Outpatient Prescriptions  Medication Sig Dispense Refill  . aluminum-magnesium hydroxide 200-200 MG/5ML suspension Take by mouth every 6 (six) hours as needed.      Marland Kitchen aspirin 325 MG EC tablet Take 325 mg by mouth daily.      . Eszopiclone (ESZOPICLONE) 3 MG TABS Take 3 mg by mouth at bedtime. Take immediately before bedtime      . furosemide (LASIX) 40 MG tablet Take 40 mg by mouth daily.      . insulin glargine (LANTUS) 100 UNIT/ML injection Inject into the skin at bedtime.      . insulin lispro (HUMALOG KWIKPEN) 100 UNIT/ML injection Inject into the skin as  needed.      Boris Lown Oil 300 MG CAPS Take 300 mg by mouth daily.      Marland Kitchen levothyroxine (SYNTHROID, LEVOTHROID) 175 MCG tablet Take 175 mcg by mouth daily.      . metoprolol succinate (TOPROL-XL) 50 MG 24 hr tablet Take 50 mg by mouth daily. Take with or immediately following a meal.      . Multiple Vitamin (MULTIVITAMIN) tablet Take 1 tablet by mouth daily.      . niacin 100 MG tablet Take 100 mg by mouth daily with breakfast.      . omeprazole (PRILOSEC) 20 MG capsule Take 20 mg by mouth daily.      . psyllium (REGULOID) 0.52 G capsule Take 0.52 g by mouth daily.      . Tamsulosin HCl (FLOMAX) 0.4 MG CAPS Take 0.4 mg by mouth daily.      Marland Kitchen zinc gluconate 50 MG tablet Take 50 mg by mouth daily.      Marland Kitchen zolpidem (AMBIEN) 5 MG tablet Take 5  mg by mouth at bedtime as needed.       No current facility-administered medications for this visit.   REVIEW OF SYSTEMS: Arly.Keller ] denotes positive finding; [  ] denotes negative finding  CARDIOVASCULAR:  [ ]  chest pain   [ ]  chest pressure   [ ]  palpitations   [ ]  orthopnea   Arly.Keller ] dyspnea on exertion   [ ]  claudication   [ ]  rest pain   [ ]  DVT   [ ]  phlebitis PULMONARY:   [ ]  productive cough   [ ]  asthma   [ ]  wheezing NEUROLOGIC:   [ ]  weakness  [ ]  paresthesias  [ ]  aphasia  [ ]  amaurosis  [ ]  dizziness HEMATOLOGIC:   [ ]  bleeding problems   [ ]  clotting disorders MUSCULOSKELETAL:  [ ]  joint pain   [ ]  joint swelling [ ]  leg swelling GASTROINTESTINAL: [ ]   blood in stool  [ ]   hematemesis GENITOURINARY:  [ ]   dysuria  [ ]   hematuria PSYCHIATRIC:  [ ]  history of major depression INTEGUMENTARY:  Arly.Keller ] rashes  Arly.Keller ] ulcers CONSTITUTIONAL:  [ ]  fever   [ ]  chills  PHYSICAL EXAM: Filed Vitals:   01/25/13 1620  BP: 156/76  Pulse: 89  Height: 5\' 9"  (1.753 m)  Weight: 220 lb 12.8 oz (100.154 kg)  SpO2: 97%   Body mass index is 32.59 kg/(m^2). GENERAL: The patient is a well-nourished male, in no acute distress. The vital signs are documented above. CARDIOVASCULAR: There is a regular rate and rhythm. I do not detect carotid bruits. He has palpable femoral pulses. Because of his leg swelling, I cannot palpate popliteal or pedal pulses. He has significant bilateral lower extremity swelling. PULMONARY: There is good air exchange bilaterally without wheezing or rales. ABDOMEN: Soft and non-tender with normal pitched bowel sounds.  NEUROLOGIC: No focal weakness or paresthesias are detected. SKIN: He has a full thickness wound on the lateral aspect on the plantar aspect of his right foot related to a pressure sore. In addition his left heel was debrided at the wound care center today and he has an extensive full-thickness wound on the left heel with reasonable granulation tissue. PSYCHIATRIC: The  patient has a normal affect.  DATA:  I have reviewed the records from the wound care center as described above.  I have independently interpreted his arterial Doppler study today. He was noted to have  a biphasic Doppler signals in the dorsalis pedis and posterior tibial positions bilaterally. ABI was 100% bilaterally. Toe pressure on the right was 99 mmHg. Toe pressure on the left was 86 mm of mercury.  MEDICAL ISSUES:  Atherosclerosis of native arteries of the extremities with ulceration(440.23) The patient has an extensive heel ulcer on the left and also a deep wound on the plantar aspect of his right foot. Given his history of diabetes and the risk of nonhealing I have recommended arteriography to be sure that he has adequate circulation for healing. His noninvasive Doppler studies to just adequate circulation for healing, however, these ABIs may be falsely elevated because of calcific disease. In addition I cannot palpate pedal pulses because of his swelling. I have reviewed with the patient the indications for arteriography. In addition, I have reviewed the potential complications of arteriography including but not limited to: Bleeding, arterial injury, arterial thrombosis, dye action, renal insufficiency, or other unpredictable medical problems. I have explained to the patient that if we find disease amenable to angioplasty we could potentially address this at the same time. I have discussed the potential complications of angioplasty and stenting, including but not limited to: Bleeding, arterial thrombosis, arterial injury, dissection, or the need for surgical intervention. We will make further recommendations pending the results of his arteriogram which is scheduled for 01/30/2013.   Isabelly Kobler S [facility] [place] Beeper: [phone]

## 2013-01-30 NOTE — Interval H&P Note (Signed)
History and Physical Interval Note:  01/30/2013 8:35 AM  Charles Castaneda  has presented today for surgery, with the diagnosis of pvd  The various methods of treatment have been discussed with the patient and family. After consideration of risks, benefits and other options for treatment, the patient has consented to  Procedure(s): ABDOMINAL AORTAGRAM (N/A) as a surgical intervention .  The patient's history has been reviewed, patient examined, no change in status, stable for surgery.  I have reviewed the patient's chart and labs.  Questions were answered to the patient's satisfaction.     DICKSON,CHRISTOPHER S

## 2013-01-30 NOTE — Telephone Encounter (Addendum)
Message copied by Fredrich Birks on Mon Jan 30, 2013  2:40 PM ------      Message from: Melene Plan      Created: Mon Jan 30, 2013 10:02 AM      Regarding: FW: charge and f/u                   ----- Message -----         From: Chuck Hint, MD         Sent: 01/30/2013   9:56 AM           To: Reuel Derby, Melene Plan, RN, #      Subject: charge and f/u                                           PROCEDURE:       1. Ultrasound-guided access to the right common femoral artery      2. Aortogram with bilateral iliac arteriogram      3. Selective catheterization of the left common iliac artery with left lower extremity runoff      4. Retrograde right femoral arteriogram with right lower extremity runoff            SURGEON: Di Kindle. Edilia Bo, MD, FACS            He is being followed in the wound care center. I need to see him in 6 months. If his wound on the right foot failed to heal he could be considered for PTA and stent of the right above-knee popliteal artery. Thank you.      CD ------  01/30/13: lm for pt and mailed letter, dpm

## 2013-01-30 NOTE — Op Note (Signed)
   PATIENT: Charles Castaneda   MRN: [id number] DOB: Jun 10, 1955    DATE OF PROCEDURE: 01/30/2013  INDICATIONS: Charles Castaneda is a 57 y.o. male who was sent by the wound care center with extensive wounds of both feet. His Doppler study suggested adequate circulation for healing. However, given the extent of the wounds I elected to proceed with arteriography.  PROCEDURE:  1. Ultrasound-guided access to the right common femoral artery 2. Aortogram with bilateral iliac arteriogram 3. Selective catheterization of the left common iliac artery with left lower extremity runoff 4. Retrograde right femoral arteriogram with right lower extremity runoff  SURGEON: Di Kindle. Edilia Bo, MD, FACS  ANESTHESIA: local with sedation   EBL: minimal  TECHNIQUE: The patient was taken to the peripheral vascular lab and sedated with 1 mg of Versed and 50 mcg of fentanyl. Both groins were prepped and draped in usual sterile fashion. Under ultrasound guidance, and after the skin was anesthetized with 1% lidocaine, the right common femoral artery was cannulated with a micropuncture needle and a micropuncture sheath introduced over a wire. This was then exchanged for a 5 [place] sheath over a [employer] wire. A pigtail catheter was positioned at the L1 vertebral body and flush aortogram obtained. The catheter was positioned above the aortic bifurcation and oblique iliac projections were obtained. Next the pigtail catheter was exchanged for a crossover catheter which was positioned into the left common iliac artery. An angled Glidewire was advanced into the external iliac artery on the left than the crossover catheter exchange for an endhole catheter. Selective left external iliac arteriogram was obtained with left lower extremity runoff. This catheter was then removed. A retrograde right femoral arteriogram was obtained with right lower extremity runoff.  FINDINGS:  1. There are single renal arteries bilaterally with no significant  renal artery stenosis identified. There is a mild irregularity in the proximal right renal artery. 2. The infrarenal aorta, bilateral common iliac arteries, bilateral external iliac arteries, and bilateral hypogastric arteries are patent. There is some mild irregularity in the common iliac arteries but no stenosis. 3. On the left side, the common femoral and deep femoral artery are patent. The superficial femoral artery is patent as is the popliteal artery. There is three-vessel runoff on the left the the anterior tibial, posterior tibial, and peroneal arteries. The plantar arch is patent. 4. On the right side, the common femoral and deep femoral artery patent. The superficial femoral artery is patent. 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  There is mild irregularity in the popliteal artery below that. There is three-vessel runoff on the right anterior tibial, posterior tibial, and peroneal arteries.  CLINICAL NOTE: Based on the toe pressures the patient has adequate circulation to heal the wounds on both feet. Circulation on the left is normal with no disease which would be affecting perfusion. On the right side, if the wound failed to progress he could be considered for angioplasty of the right above-knee popliteal artery stenosis via a left groin approach.  Waverly Ferrari, MD, FACS [facility]  DATE OF DICTATION:   01/30/2013

## 2013-01-31 MED ORDER — INFLUENZA VAC SPLIT QUAD 0.5 ML IM SUSP: 0.5000 mL | INTRAMUSCULAR | Status: DC

## 2013-01-31 MED ORDER — PNEUMOCOCCAL VAC POLYVALENT 25 MCG/0.5ML IJ INJ: 0.5000 mL | INJECTION | INTRAMUSCULAR | Status: DC

## 2013-01-31 NOTE — Discharge Summary (Signed)
[facility] Discharge Summary   Patient ID:  Charles Castaneda MRN: [id number] DOB/AGE: 55-15-1957 55 y.o.  Admit date: 01/30/2013 Discharge date: 01/31/2013 Date of Surgery: 01/30/2013 Surgeon: Surgeon(s): Chuck Hint, MD  Admission Diagnosis: pvd  Discharge Diagnoses:  pvd  Secondary Diagnoses: Past Medical History  Diagnosis Date  . Thyroid disease   . Carotid artery occlusion   . Hypertension   . Diabetes mellitus without complication     Procedure(s): ABDOMINAL AORTAGRAM ANGIOGRAM EXTREMITY BILATERAL  Discharged Condition: good  HPI:  Charles Castaneda is a 57 y.o. male who was seen in consultation by Dr. Hart Rochester on 02/23/2012 with cellulitis in the left foot and an ulceration on the left foot. Previously he had an ulcer on his right heel which ultimately healed. Doppler studies at that time showed an ABI of 73% on the right and 89% on the left. It was noted that he had triphasic flow in both feet, and Dr. Hart Rochester felt that he had adequate circulation to heal the areas on his left foot.  He had been doing well until approximately 3 weeks ago when he developed a wound on the lateral aspect of his right foot and also on the left heel. In reviewing the records from the [facility] wound healing center, it appears that these wounds were related to severe fluid retention. This was being treated with compression stockings. In addition the wounds were being offloaded. He was seen in the wound care center today with [facility] grade 2 diabetic foot ulcers bilaterally. Given the extensive nature of the wound he was sent for urgent vascular consultation.  I do not get any clear-cut history of claudication or rest pain. 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 He states he's had problems with swelling in both legs on and off since 2007.  From a vascular standpoint, he has also undergone a previous right carotid endarterectomy by Dr. Liliane Bade in  2007.   Hospital Course:  Charles Castaneda is a 57 y.o. male is S/P  Procedure(s): ABDOMINAL AORTAGRAM ANGIOGRAM EXTREMITY BILATERAL  Physical exam: Extremities: Incisions clean, dry and intact  No right groin hematoma  BLE warm with skin changes as per-op Pt. Ambulating, voiding and taking PO diet without difficulty. Pt pain controlled with PO pain meds. Labs as below Complications:none  Consults:     Significant Diagnostic Studies: CBC Lab Results  Component Value Date   WBC 12.4* 08/01/2008   HGB 12.6* 01/30/2013   HCT 37.0* 01/30/2013   MCV 87.9 08/01/2008   PLT 692* 08/01/2008    BMET    Component Value Date/Time   NA 138 01/30/2013 0803   K 4.0 01/30/2013 0803   CL 100 01/30/2013 0803   CO2 29 08/01/2008 0010   GLUCOSE 293* 01/30/2013 0803   BUN 12 01/30/2013 0803   CREATININE 1.10 01/30/2013 0803   CALCIUM 8.3* 08/01/2008 0010   GFRNONAA >60 08/01/2008 0010   GFRAA  Value: >60        The eGFR has been calculated using the MDRD equation. This calculation has not been validated in all clinical situations. eGFR's persistently <60 mL/min signify possible Chronic Kidney Disease. 08/01/2008 0010   COAG No results found for this basename: INR, PROTIME     Disposition:  Discharge to :Home Discharge Orders   Future Appointments Provider Department Dept Phone   08/02/2013 9:00 AM Chuck Hint, MD [facility] [phone]   Future Orders Complete  By Expires   Call MD for:  redness, tenderness, or signs of infection (pain, swelling, bleeding, redness, odor or green/yellow discharge around incision site)  As directed    Call MD for:  severe or increased pain, loss or decreased feeling  in affected limb(s)  As directed    Call MD for:  temperature >100.5  As directed    Driving Restrictions  As directed    Comments:     No driving for 24 hours and while taking pain medication.   Lifting restrictions  As directed    Comments:     No  heavy lifting for 3 weeks   may wash over wound with mild soap and water  As directed    Scheduling Instructions:     Shower daily with soap and water starting 01/31/13   Resume previous diet  As directed        Medication List         aspirin 325 MG EC tablet  Take 325 mg by mouth daily.     benzocaine 10 % mucosal gel  Commonly known as:  ORAJEL  Use as directed 1 application in the mouth or throat as needed for mouth pain.     cetirizine 10 MG tablet  Commonly known as:  ZYRTEC  Take 10 mg by mouth daily as needed for allergies.     cholecalciferol 400 UNITS Tabs tablet  Commonly known as:  VITAMIN D  Take 400 Units by mouth daily.     eszopiclone 3 MG Tabs  Generic drug:  Eszopiclone  Take 3 mg by mouth at bedtime. Take immediately before bedtime     furosemide 40 MG tablet  Commonly known as:  LASIX  Take 40 mg by mouth daily.     HUMALOG KWIKPEN 100 UNIT/ML injection  Generic drug:  insulin lispro  Inject 10-20 Units into the skin 3 (three) times daily with meals. *per sliding scale*     HYDROcodone-acetaminophen 10-325 MG per tablet  Commonly known as:  NORCO  Take 1 tablet by mouth every 6 (six) hours as needed for severe pain.     insulin glargine 100 UNIT/ML injection  Commonly known as:  LANTUS  Inject 70 Units into the skin at bedtime.     Krill Oil 300 MG Caps  Take 300 mg by mouth daily.     levothyroxine 200 MCG tablet  Commonly known as:  SYNTHROID, LEVOTHROID  Take 200 mcg by mouth daily before breakfast.     metoprolol 50 MG tablet  Commonly known as:  LOPRESSOR  Take 75 mg by mouth 2 (two) times daily.     multivitamin tablet  Take 1 tablet by mouth daily.     niacin 100 MG tablet  Take 100 mg by mouth daily with breakfast.     omeprazole 20 MG capsule  Commonly known as:  PRILOSEC  Take 20 mg by mouth daily.     psyllium 0.52 G capsule  Commonly known as:  REGULOID  Take 0.52 g by mouth daily.     tamsulosin 0.4 MG Caps  capsule  Commonly known as:  FLOMAX  Take 0.4 mg by mouth daily.     triamcinolone cream 0.1 %  Commonly known as:  KENALOG  Apply 1 application topically 2 (two) times daily.     vitamin B-12 1000 MCG tablet  Commonly known as:  CYANOCOBALAMIN  Take 1,000 mcg by mouth daily.     zinc gluconate 50 MG  tablet  Take 50 mg by mouth daily.     zolpidem 5 MG tablet  Commonly known as:  AMBIEN  Take 5 mg by mouth at bedtime as needed.       Verbal and written Discharge instructions given to the patient. Wound care per Discharge AVS     Follow-up Information   Follow up with DICKSON,CHRISTOPHER S, MD. (office will arrange)    Specialty:  Vascular Surgery   Contact information:   [address] [place] [address] [phone]       Signed: Marlowe Shores 01/31/2013, 2:26 PM

## 2013-01-31 NOTE — Progress Notes (Signed)
Inpatient Diabetes Program Recommendations  AACE/ADA: New Consensus Statement on Inpatient Glycemic Control (2013)  Target Ranges:  Prepandial:   less than 140 mg/dL      Peak postprandial:   less than 180 mg/dL (1-2 hours)      Critically ill patients:  140 - 180 mg/dL   Reason for Visit: Results for Charles Castaneda, Charles Castaneda (MRN [id number]) as of 01/31/2013 10:31  Ref. Range 01/30/2013 09:51 01/30/2013 11:39 01/30/2013 16:27 01/30/2013 20:32 01/31/2013 07:21  Glucose-Capillary Latest Range: 70-99 mg/dL 409 (H) 811 (H) 914 (H) 288 (H) 263 (H)   Please add Novolog correction moderate tid with meals.  Also please add Novolog meal coverage 10 units tid with meals.  Check A1C to determine pre-hospitalization glycemic control.    Will follow. Beryl Meager, RN, BC-ADM Inpatient Diabetes Coordinator Pager [phone]

## 2013-01-31 NOTE — Progress Notes (Signed)
  VASCULAR SURGERY PROGRESS NOTE   1 Day Post-Op  SUBJECTIVE: pt doing well. Ambulating. Has appt at wound center at 1 pm.  PHYSICAL EXAM: BP Readings from Last 3 Encounters:  01/31/13 179/78  01/31/13 179/78  01/25/13 156/76   Temp Readings from Last 3 Encounters:  01/31/13 98.9 F (37.2 C) Oral  01/31/13 98.9 F (37.2 C) Oral   Pulse Readings from Last 3 Encounters:  01/31/13 94  01/31/13 94  01/25/13 89   SpO2 Readings from Last 3 Encounters:  01/31/13 95%  01/31/13 95%  01/25/13 97%    Extremities: Incisions clean, dry and intact No right groin hematoma BLE warm with skin changes as per-op   LABS: Lab Results  Component Value Date   WBC 12.4* 08/01/2008   HGB 12.6* 01/30/2013   HCT 37.0* 01/30/2013   MCV 87.9 08/01/2008   PLT 692* 08/01/2008   Lab Results  Component Value Date   CREATININE 1.10 01/30/2013   No results found for this basename: INR, PROTIME       ASSESSMENT: 1 Day Post-Op s/p  1. Ultrasound-guided access to the right common femoral artery  2. Aortogram with bilateral iliac arteriogram  3. Selective catheterization of the left common iliac artery with left lower extremity runoff  4. Retrograde right femoral arteriogram with right lower extremity runoff     PLAN:  Ambulate  Wound Management: BLE per wound center  F/U per our office

## 2013-02-05 NOTE — Discharge Summary (Signed)
Agree with plans for D/C.  Christopher Dickson, MD, FACS Beeper [phone] 02/05/2013  

## 2013-02-14 ENCOUNTER — Encounter: Payer: Self-pay | Admitting: Vascular Surgery

## 2013-02-24 ENCOUNTER — Ambulatory Visit (HOSPITAL_COMMUNITY): Payer: Medicare Other

## 2013-02-27 ENCOUNTER — Ambulatory Visit (HOSPITAL_COMMUNITY): Payer: Medicare Other

## 2013-02-28 ENCOUNTER — Ambulatory Visit (HOSPITAL_COMMUNITY): Payer: Medicare Other

## 2013-04-17 ENCOUNTER — Ambulatory Visit (HOSPITAL_COMMUNITY)
Admission: RE | Admit: 2013-04-17 | Discharge: 2013-04-17 | Disposition: A | Discharge status: Home / Self Care | Payer: Medicare Other | Source: Ambulatory Visit | Attending: Internal Medicine | Admitting: Internal Medicine

## 2013-04-17 VITALS — BP 162/75 | HR 78 | Resp 16

## 2013-04-17 DIAGNOSIS — S91309A Unspecified open wound, unspecified foot, initial encounter: Secondary | ICD-10-CM | POA: Insufficient documentation

## 2013-04-17 DIAGNOSIS — X58XXXA Exposure to other specified factors, initial encounter: Secondary | ICD-10-CM | POA: Insufficient documentation

## 2013-04-17 DIAGNOSIS — Z95828 Presence of other vascular implants and grafts: Secondary | ICD-10-CM

## 2013-04-17 MED ORDER — SODIUM CHLORIDE 0.9 % IJ SOLN
10.0000 mL | INTRAMUSCULAR | Status: DC | PRN
  Administered 2013-04-17: 20 mL
  Administered 2013-04-17: 30 mL
  Administered 2013-04-17: 10 mL

## 2013-04-17 NOTE — Discharge Instructions (Signed)
Peripherally Inserted Central Catheter/Midline Placement  The IV Nurse has discussed with the patient and/or persons authorized to consent for the patient, the purpose of this procedure and the potential benefits and risks involved with this procedure.  The benefits include less needle sticks, lab draws from the catheter and patient may be discharged home with the catheter.  Risks include, but not limited to, infection, bleeding, blood clot (thrombus formation), and puncture of an artery; nerve damage and irregular heat beat.  Alternatives to this procedure were also discussed.  PICC/Midline Placement Documentation  PICC / Midline Single Lumen 07/14/12 PICC Right Basilic (Active)     PICC / Midline Single Lumen 04/17/13 PICC Right Basilic 44 cm 0 cm (Active)  Indication for Insertion or Continuance of Line Home intravenous therapies (PICC only) 04/17/2013  1:00 PM  Exposed Catheter (cm) 3 cm 04/17/2013  1:00 PM  Site Assessment Clean;Dry;Intact 04/17/2013  1:00 PM  Line Status Saline locked;Blood return noted 04/17/2013  1:00 PM  Dressing Type Transparent;Securing device 04/17/2013  1:00 PM  Dressing Status Clean;Dry;Intact;Antimicrobial disc in place 04/17/2013  1:00 PM  Line Care Cap(s) changed;Connections checked and tightened 04/17/2013  1:00 PM  Line Adjustment (NICU/IV Team Only) No 04/17/2013  1:00 PM  Dressing Intervention New dressing;Antimicrobial disc changed 04/17/2013 12:00 PM  Dressing Change Due 04/24/13 04/17/2013  1:00 PM     PICC Insertion, Care After Refer to this sheet in the next few weeks. These instructions provide you with information on caring for yourself after your procedure. Your health care provider may also give you more specific instructions. Your treatment has been planned according to current medical practices, but problems sometimes occur. Call your health care provider if you have any problems or questions after your procedure. WHAT TO EXPECT AFTER THE PROCEDURE After your  procedure, it is typical to have the following:  Mild discomfort at the insertion site. This should not last more than a day. HOME CARE INSTRUCTIONS  Rest at home for the remainder of the day after the procedure.  You may bend your arm and move it freely. If your PICC is near or at the bend of your elbow, avoid activity with repeated motion at the elbow.  Avoid lifting heavy objects as instructed by your health care provider.  Avoid using a crutch with the arm on the same side as your PICC. You may need to use a walker. Bandage Care  Keep your PICC bandage (dressing) clean and dry to prevent infection.  Ask your health care provider when you may shower. To keep the dressing dry, cover the PICC with plastic wrap and tape before showering. If the dressing does become wet, replace it right after the shower.  Do not soak in the bath, swim, or use hot tubs when you have a PICC.  Change the PICC dressing as instructed by your health care provider.  Change your PICC dressing if it becomes loose or wet. General PICC Care  Check the PICC insertion site daily for leakage, redness, swelling, or pain.  Flush the PICC as directed by your health care provider. Let your health care provider know right away if the PICC is difficult to flush or does not flush. Do not use force to flush the PICC.   Do not use a syringe that is less than 10 mL to flush the PICC.  Never pull or tug on the PICC.  Avoid blood pressure checks on the arm with the PICC.  Keep your PICC identification card  with you at all times.  Do not take the PICC out yourself. Only a trained health care professional should remove the PICC.  SEEK MEDICAL CARE IF:  You have pain in your arm, ear, face, or teeth.  You have fever or chills.  You have drainage from the PICC insertion site.  You have redness or palpate a "cord" around the PICC insertion site.  You cannot flush the catheter. SEEK IMMEDIATE MEDICAL CARE  IF:  You have swelling in the arm in which the PICC is inserted. Document Released: 12/14/2012 Document Reviewed: 10/31/2012 [facility] Patient Information 2014 [place], [place].

## 2013-04-17 NOTE — Progress Notes (Signed)
PICC line was inserted by myself, Maleea Camilo, RN, into's rt basilic vein at 16XW44cm long, blood return noted. CXR showed the tip of the PICC was curled on itself by 3.6cm. Marinda ElkJennifer Mounce, RN talked with Arlina RobesLinda Alford, [facility] about how to go about flipping the tip of the PICC back down. Bonita QuinLinda gave the option to pull back on the PICC by 3cm, sit the pt up and power flush a few times and see it that helped or either to insert a radiology wire to see if that would help straighten out the tip. First the pt was sat up at a 90 degree angle, PICC pulled back by 3cm and power flushed by myself, CXR redone, tip of PICC still curled on itself, but by a shorter length. Then a radiology wire was inserted under sterile precautions by myself and the PICC gave no blood return. Marinda ElkJennifer Mounce, RN then inserted the radiology wire again and blood return was then noted. CXR redone and showed the tip of the PICC was in the correct position. [facility] in a satisfactory condition.

## 2013-08-02 ENCOUNTER — Ambulatory Visit: Payer: Medicare Other | Admitting: Family

## 2014-02-15 ENCOUNTER — Encounter (HOSPITAL_COMMUNITY): Payer: Self-pay | Admitting: Vascular Surgery

## 2015-01-08 DEATH — deceased

## 2015-04-27 IMAGING — CR DG CHEST 1V PORT
1 series · 1 of 1 positions shown · non-contrast
Comparison: None.

CLINICAL DATA: PICC placement.

PORTABLE CHEST - 1 VIEW

[view not recorded]
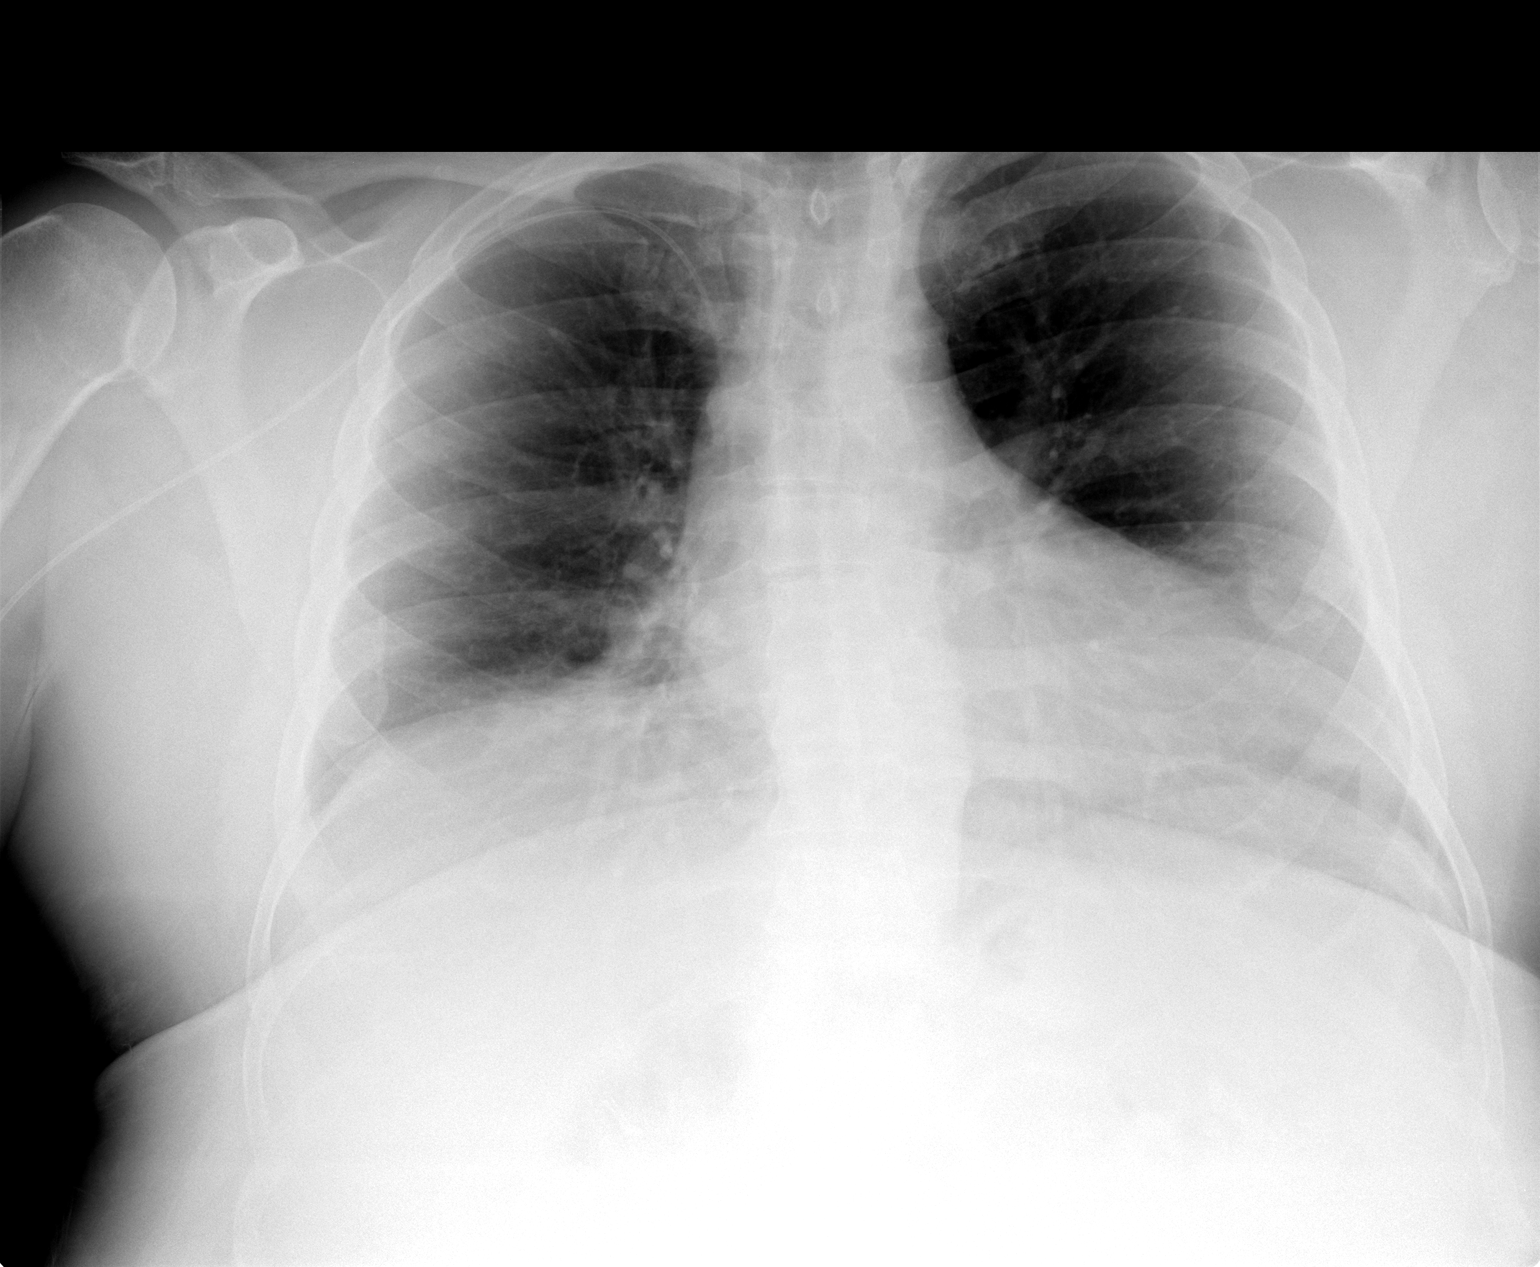

[1 of 1 positions shown; findings below may reference images not displayed]

FINDINGS: Right PICC is in place with the tip projecting over the
lower superior vena cava.  Lungs clear.  Heart size upper normal.
No pneumothorax or pleural fluid.
IMPRESSION: Tip of right PICC projects over the lower superior vena cava.  No
acute finding.
